# Patient Record
Sex: Female | Born: 1990 | Race: Black or African American | Hispanic: No | Marital: Single | Smoking: Former smoker
Health system: Southern US, Community
[De-identification: ages and names within clinical notes are randomized; demographics above are authoritative.]

## PROBLEM LIST (undated history)

## (undated) DIAGNOSIS — Z87440 Personal history of urinary (tract) infections: Secondary | ICD-10-CM

## (undated) HISTORY — DX: Personal history of urinary (tract) infections: Z87.440

## (undated) HISTORY — PX: OTHER SURGICAL HISTORY: SHX169

---
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
## 2011-07-04 IMAGING — US US OB US >=[ID] SNGL FETUS
1 series · 17 of 28 positions shown · non-contrast
Comparison: none

REASON FOR EXAM: Dates
COMMENTS:

[Series 1: us ob us >=(id) sngl fetus · 17 of 82 slices shown]
[im 1/82]
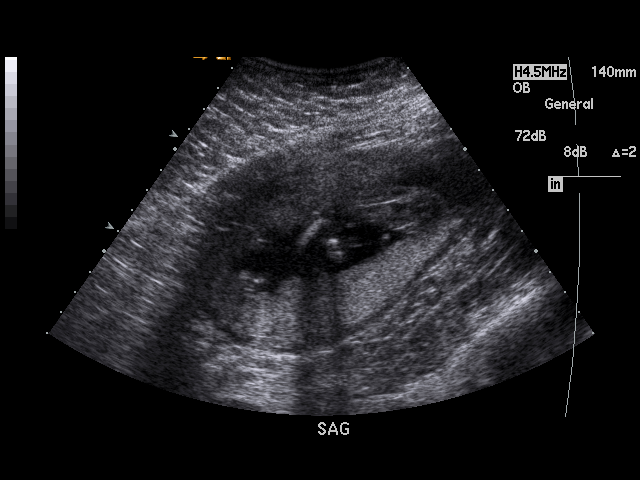
[im 7/82]
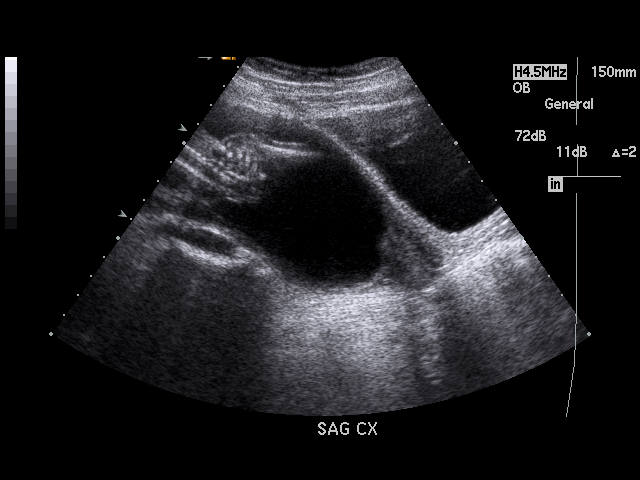
[im 13/82]
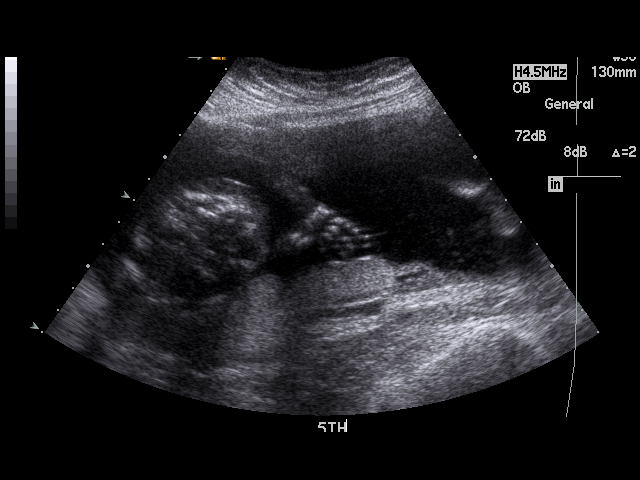
[im 16/82]
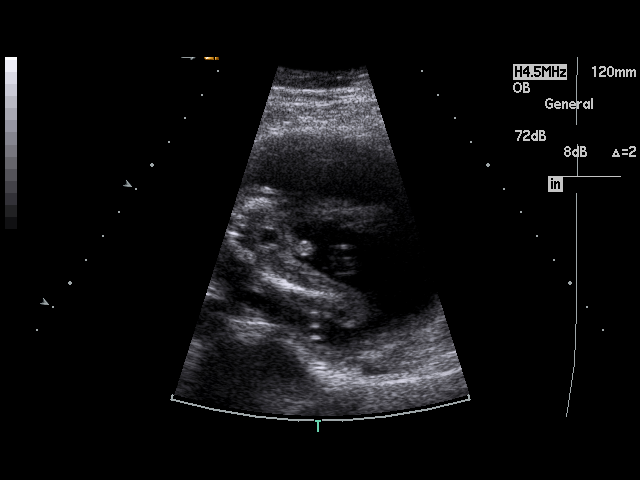
[im 22/82]
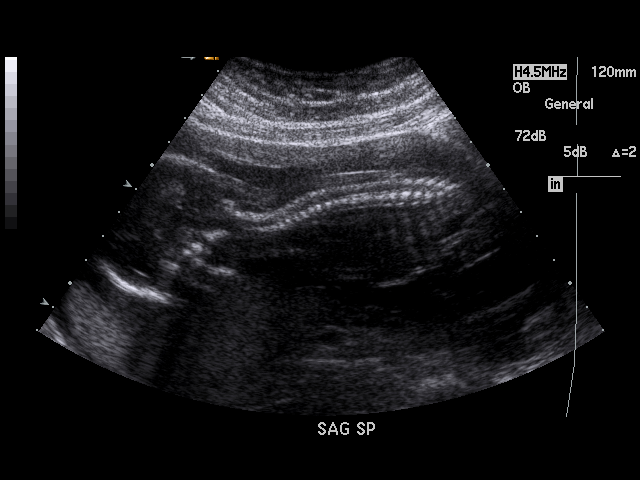
[im 28/82]
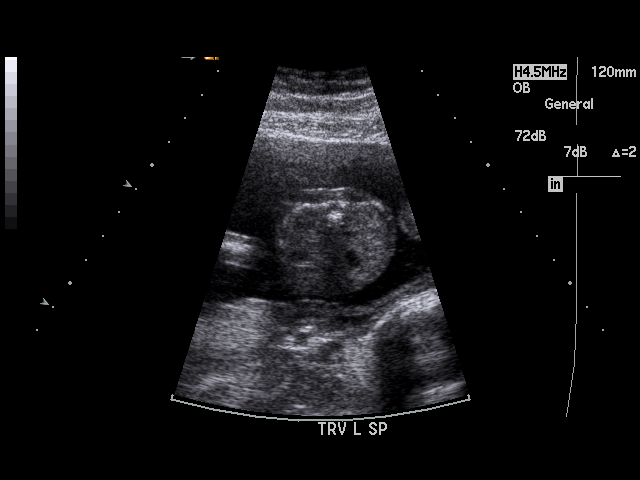
[im 31/82]
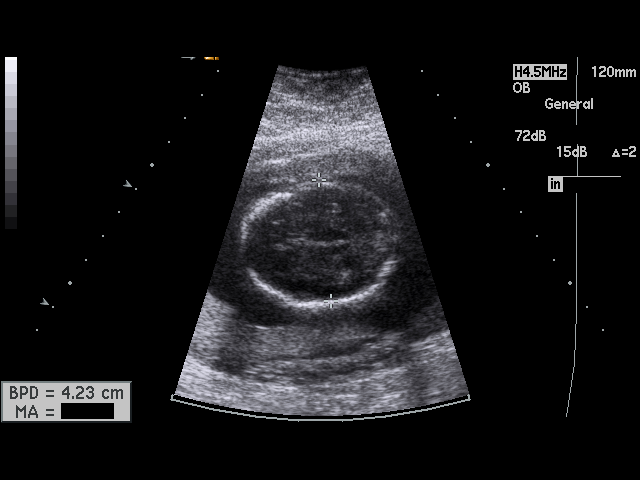
[im 37/82]
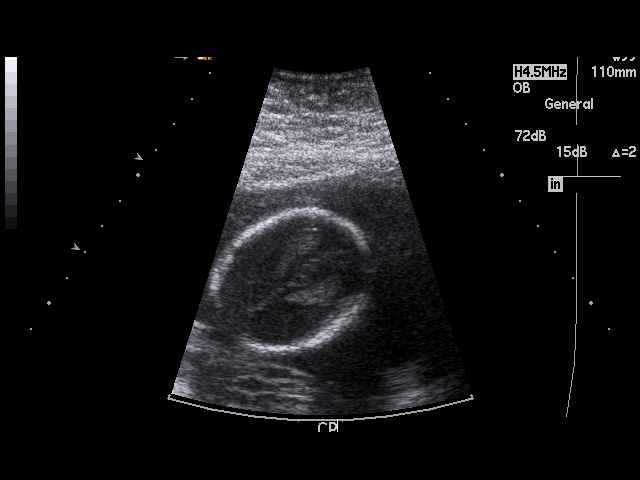
[im 43/82]
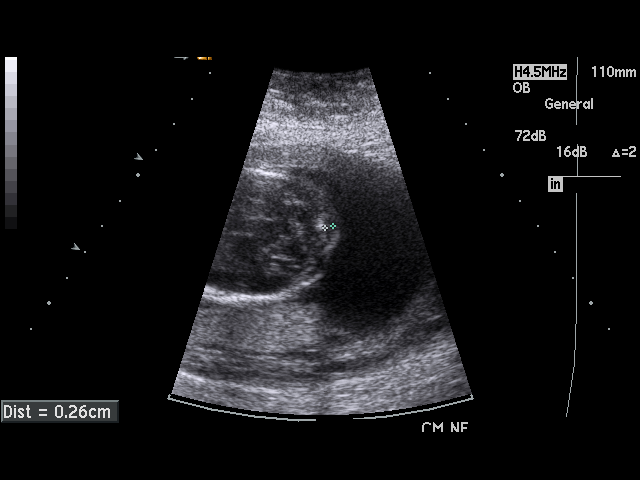
[im 46/82]
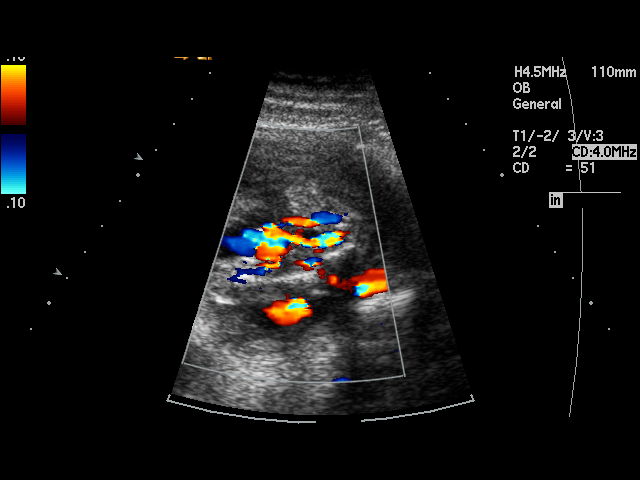
[im 52/82]
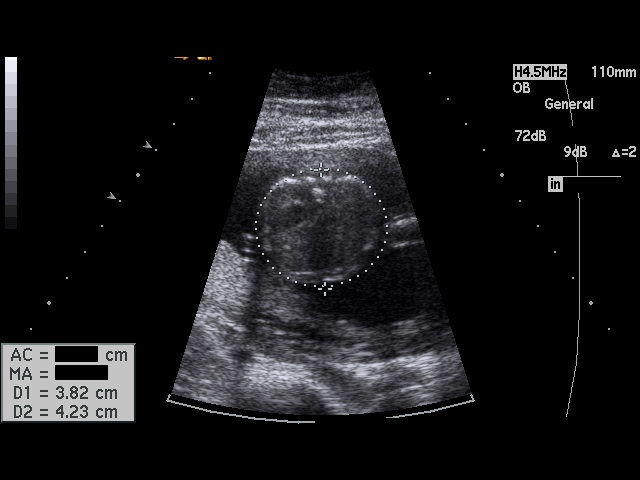
[im 55/82]
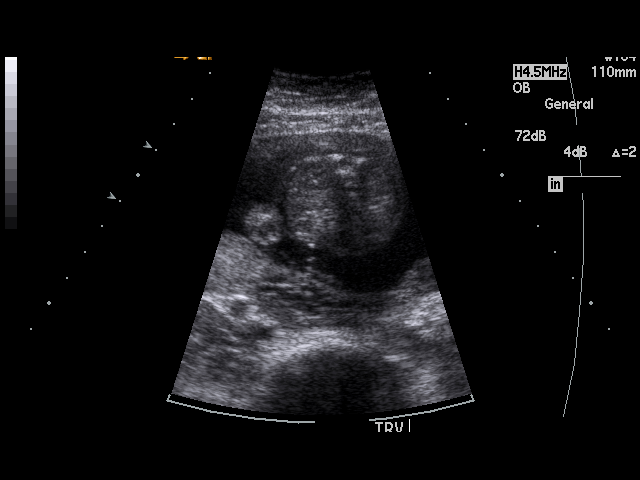
[im 61/82]
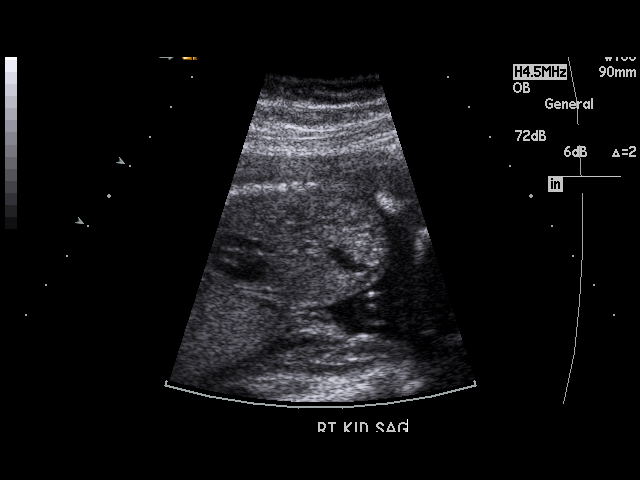
[im 67/82]
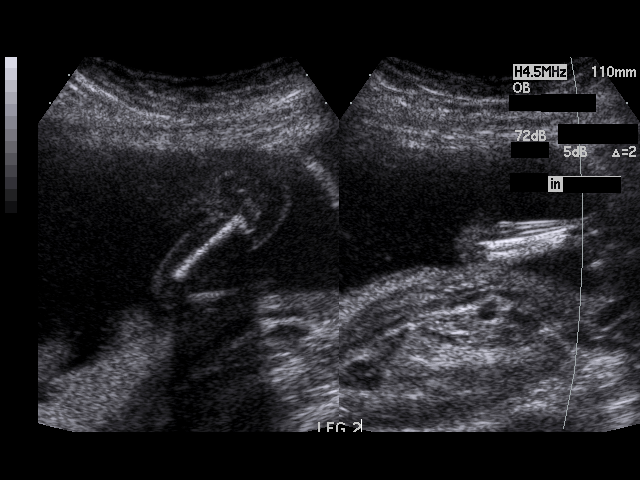
[im 70/82]
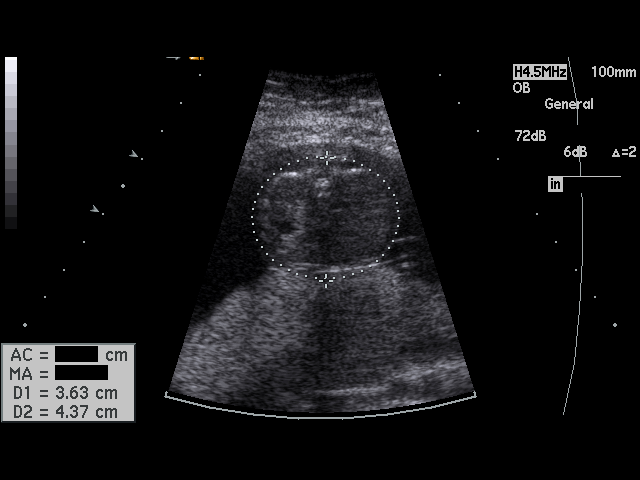
[im 76/82]
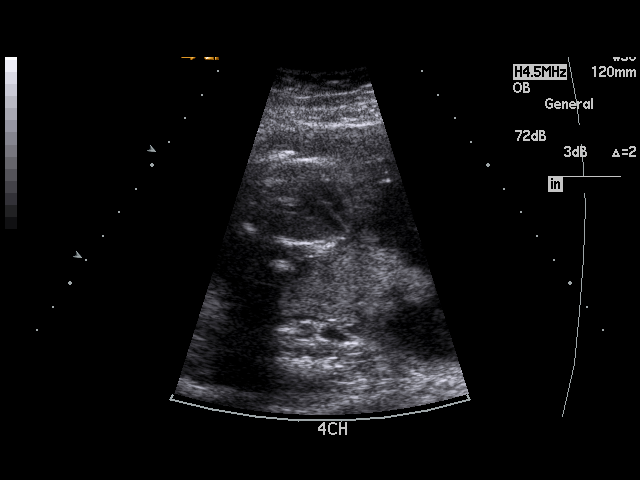
[im 82/82]
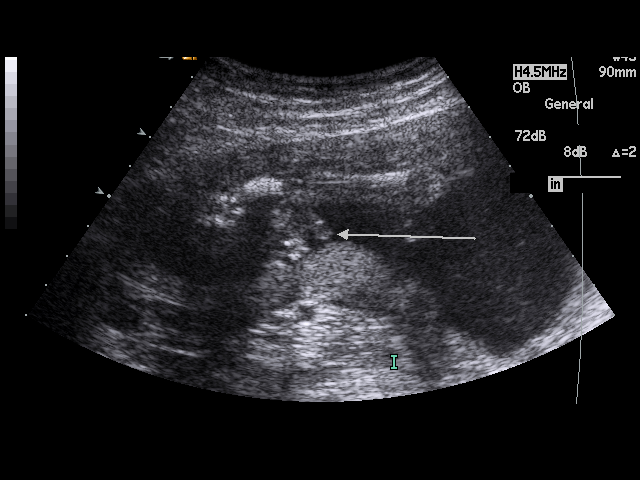

[17 of 28 positions shown; findings below may reference images not displayed]

PROCEDURE:     US  - US OB GREATER/OR EQUAL TO F3CS3  - July 24, 2009  [DATE]

RESULT:     There is observed a single living intrauterine gestation.
Amniotic volume appears normal. Fetal heart rate was monitored at 166 beats
per minute. The placenta is posterior. The inferior tip of the placenta is
approximately 8.5 cm above the cervix. Cervix length measures 2.9 cm.  The
fetal heart, stomach, and urinary bladder are visualized. No hydrocephalus
or hydronephrosis is seen. Fetal measurements are as follows:

BPD     4.21 cm      Corresponding to 18 weeks 5 days.
HC      16.24 cm      Corresponding to 19 weeks 0 days.
AC      12.89 cm      Corresponding to 18 weeks 3 days.
FL         2.9 cm      Corresponding to 18 weeks 6 days.

EFW is 276 grams + / - 37 grams. Average ultrasound age is 18 weeks 5 days.
Ultrasound EDD is December 20, 2009.
IMPRESSION: Please see above.

## 2012-05-31 ENCOUNTER — Emergency Department: Payer: Self-pay

## 2014-05-11 IMAGING — CR DG TIBIA/FIBULA 2V*L*
1 series · 2 of 2 positions shown · non-contrast
Comparison: none

REASON FOR EXAM: pain post fall
COMMENTS:

RESULT:     Left lower leg images demonstrate no fracture, dislocation or
radiopaque foreign body. Please see the separate dictation of the left knee.

[Series 1: x tib-fib ap left · 0.14mm/px · 2 of 2 slices shown]
[im 1/2]
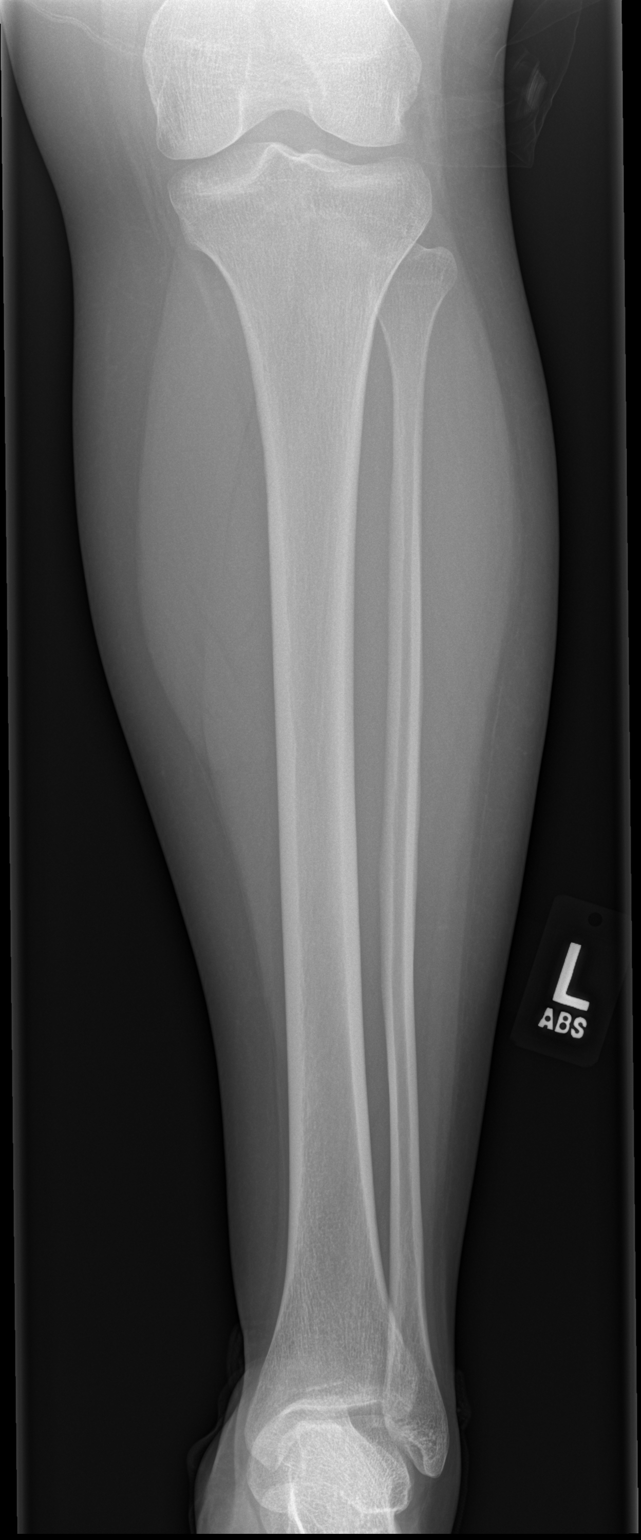
[im 2/2]
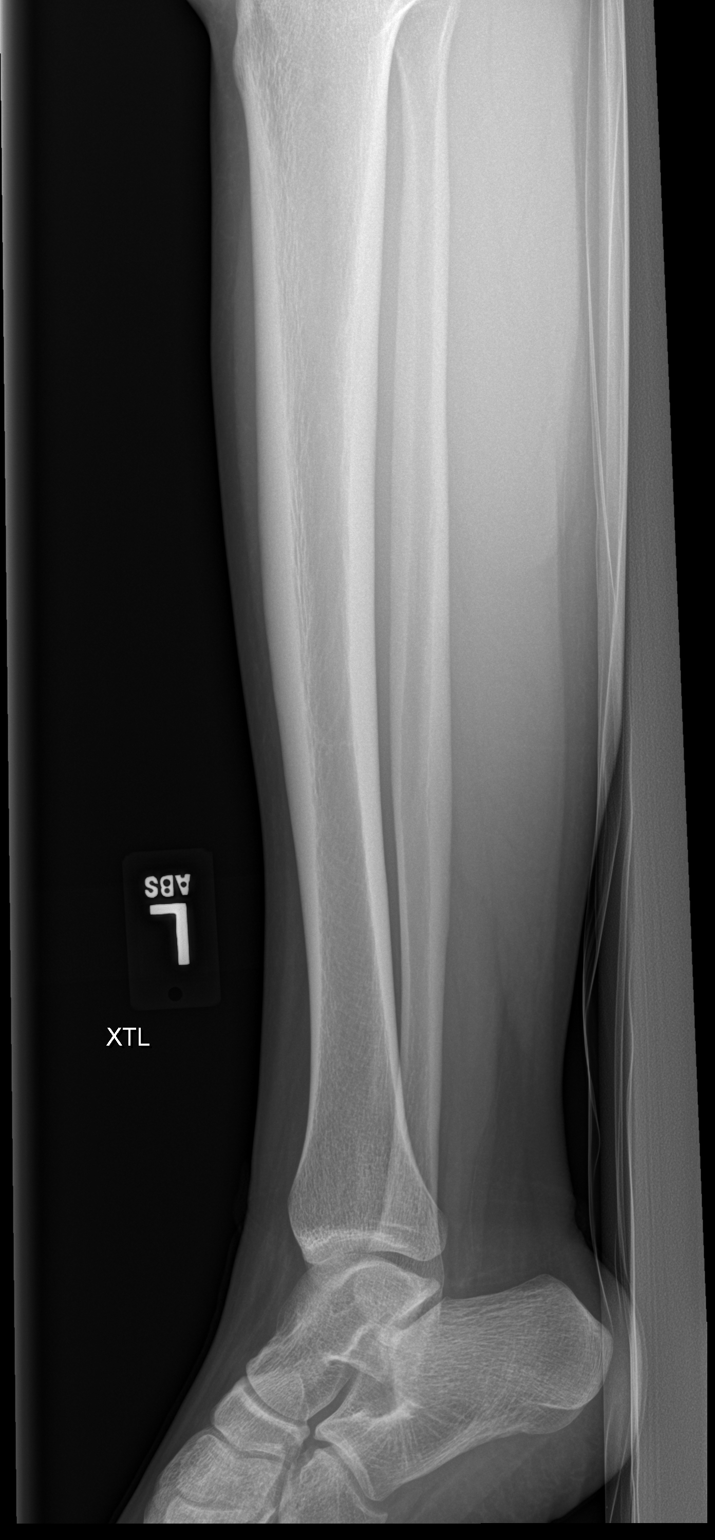

[2 of 2 positions shown; findings below may reference images not displayed]

IMPRESSION: Please see above.

[REDACTED]

## 2014-05-11 IMAGING — CR DG KNEE COMPLETE 4+V*L*
1 series · 4 of 4 positions shown · non-contrast
Comparison: none

REASON FOR EXAM: knee pain
COMMENTS:

PROCEDURE:     DXR - DXR KNEE LT COMP WITH OBLIQUES  - May 31, 2012  [DATE]
RESULT:     Comparison: None.

[Series 1: t knee ap left · 0.14mm/px · 4 of 4 slices shown]
[im 1/4]
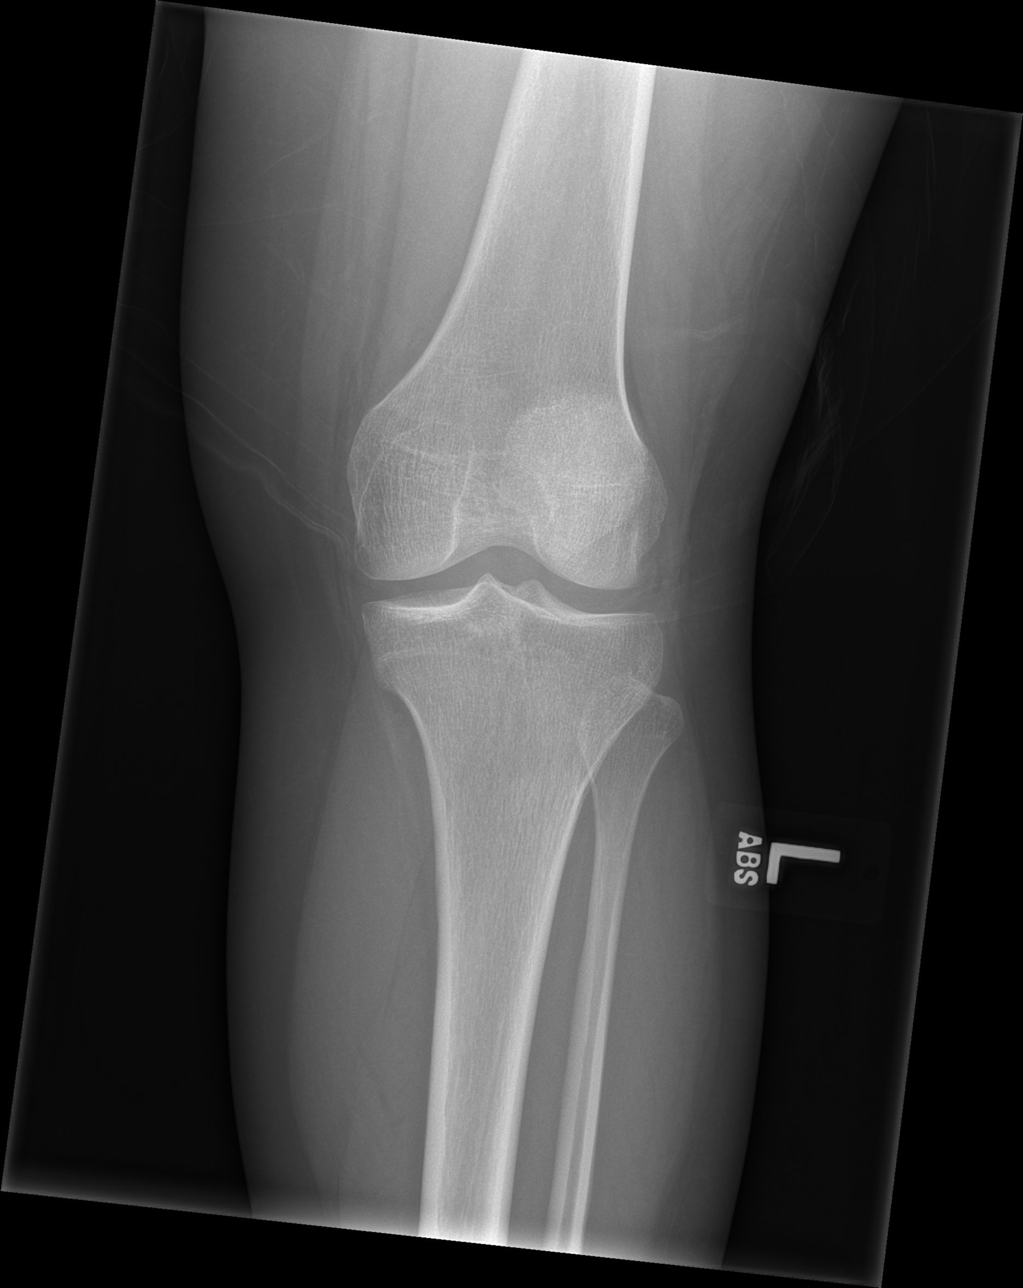
[im 2/4]
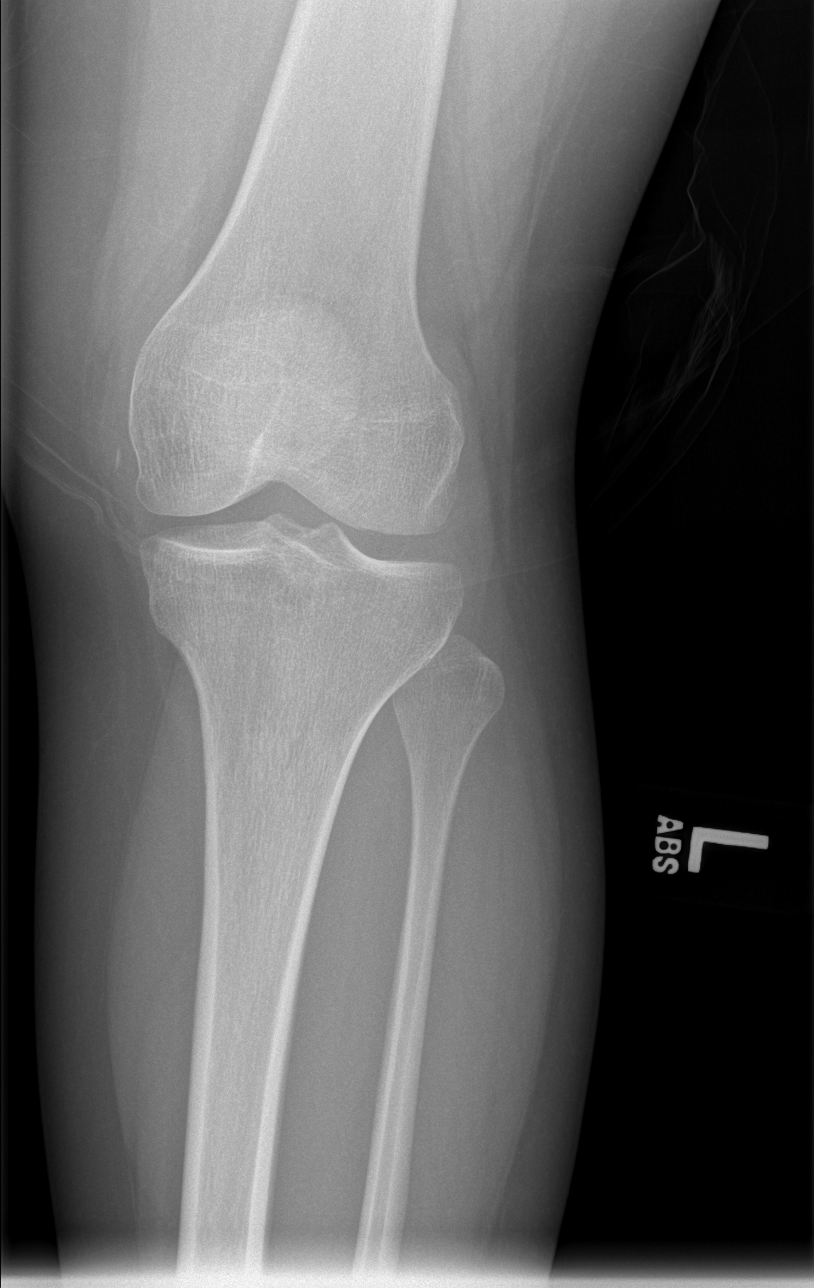
[im 3/4]
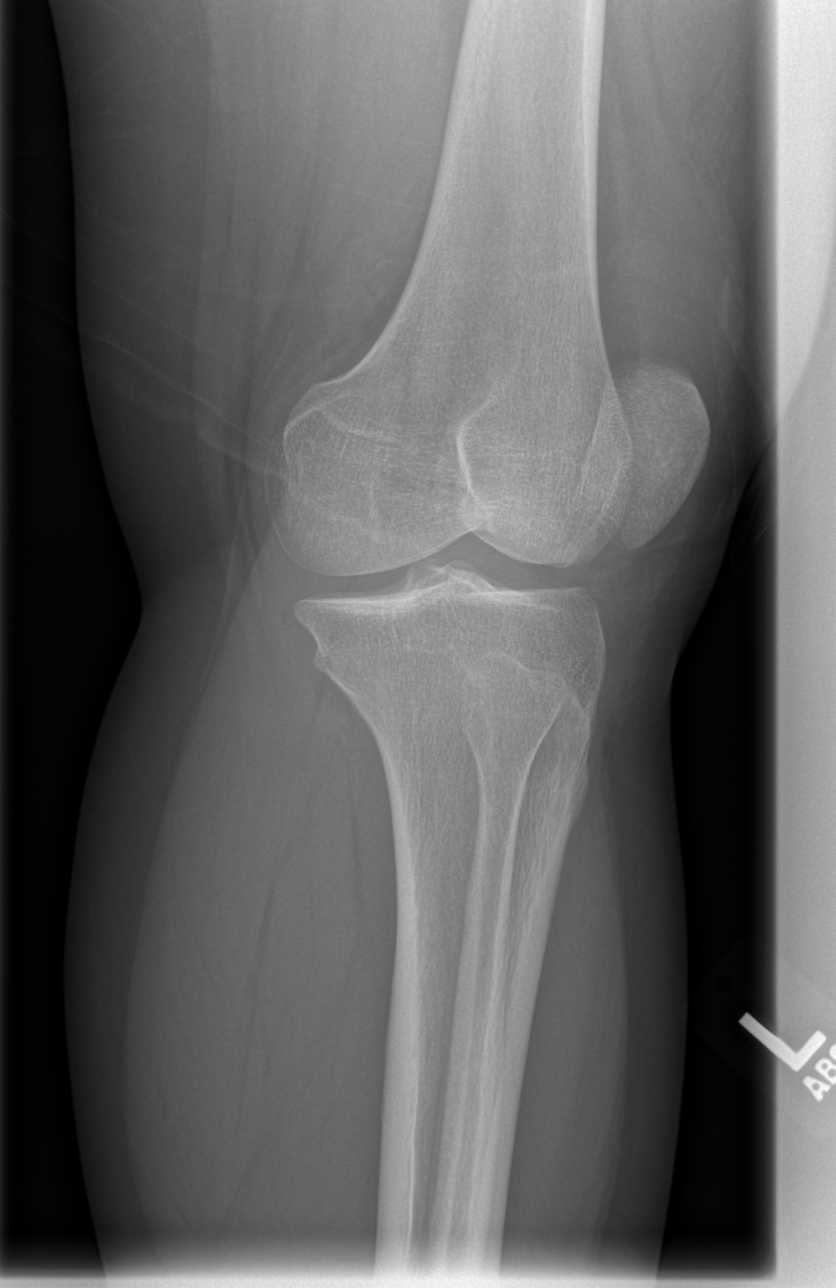
[im 4/4]
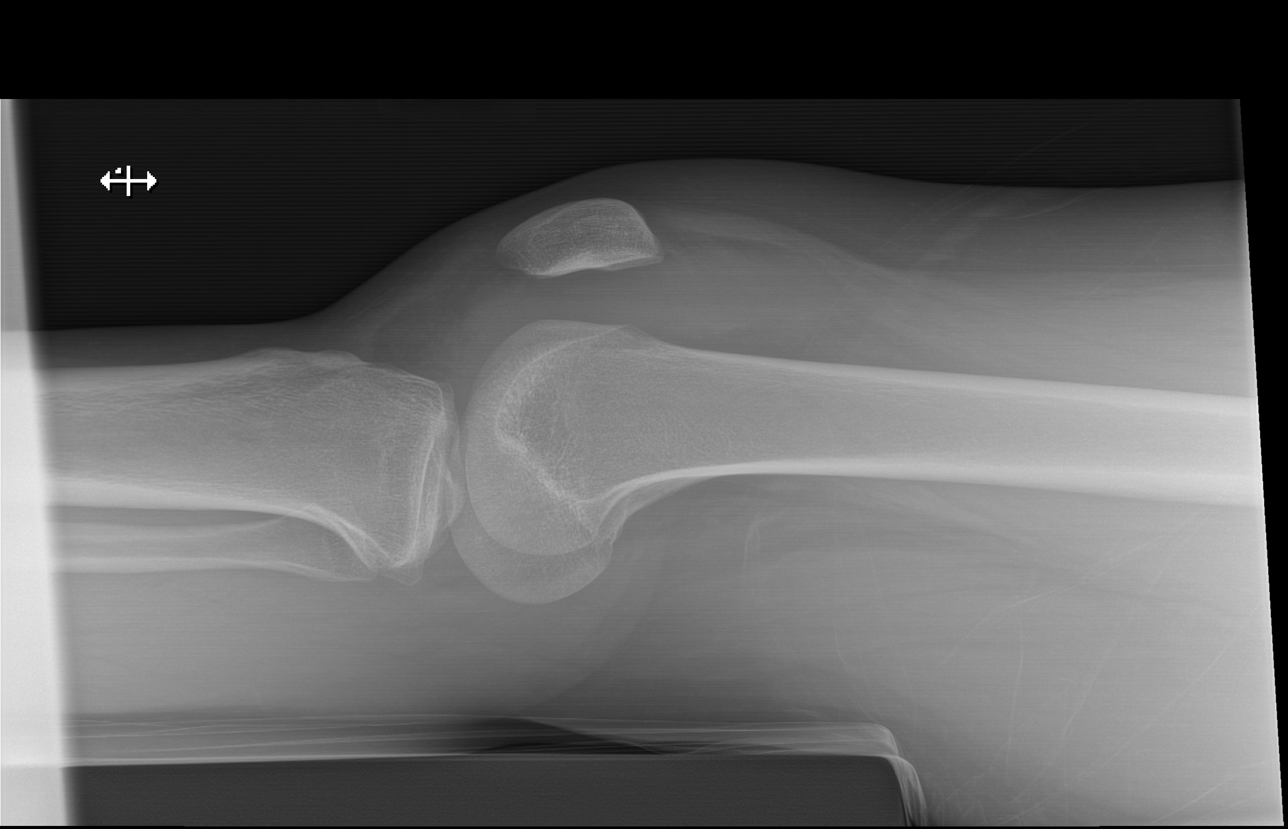

[4 of 4 positions shown; findings below may reference images not displayed]

FINDINGS: On the oblique image, there is a small crescentic calcific density adjacent
to the medial femoral condyle. This likely represents a small avulsion type
fracture, which would be in the region of the attachment of the MCL. There
is a moderate to large left knee effusion.
IMPRESSION: 1. Findings likely related to small avulsion type fracture at the attachment
of the MCL to the medial femoral condyle.
2. Moderate to large sized knee effusion.

Further evaluation could be provided with MRI, as indicated.

This was discussed with Klever Jumper at 5050 hours 05/31/2012.

[REDACTED]

## 2014-05-11 IMAGING — CR DG FEMUR 2V*L*
1 series · 5 of 5 positions shown · non-contrast
Comparison: none

REASON FOR EXAM: pain post fall
COMMENTS:

[Series 1: t femur proximal ap left · 0.14mm/px · 5 of 5 slices shown]
[im 1/5]
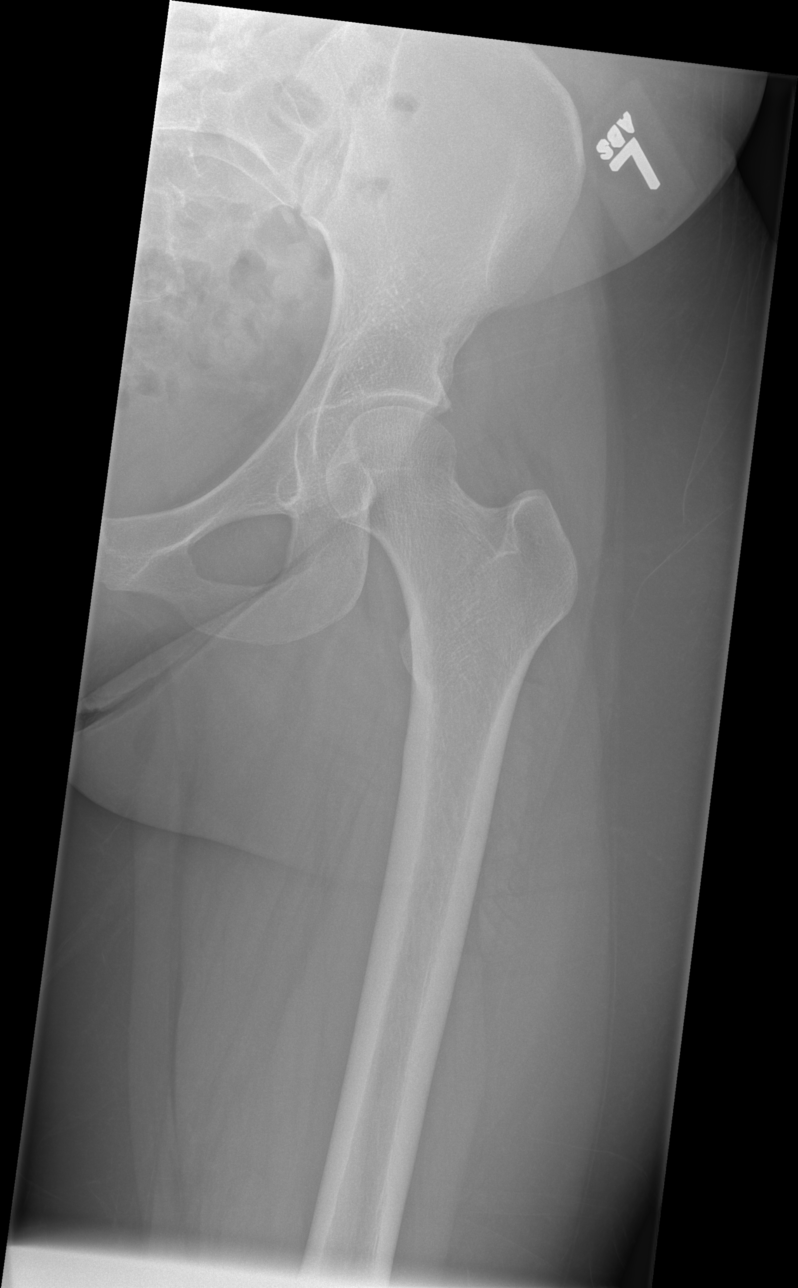
[im 2/5]
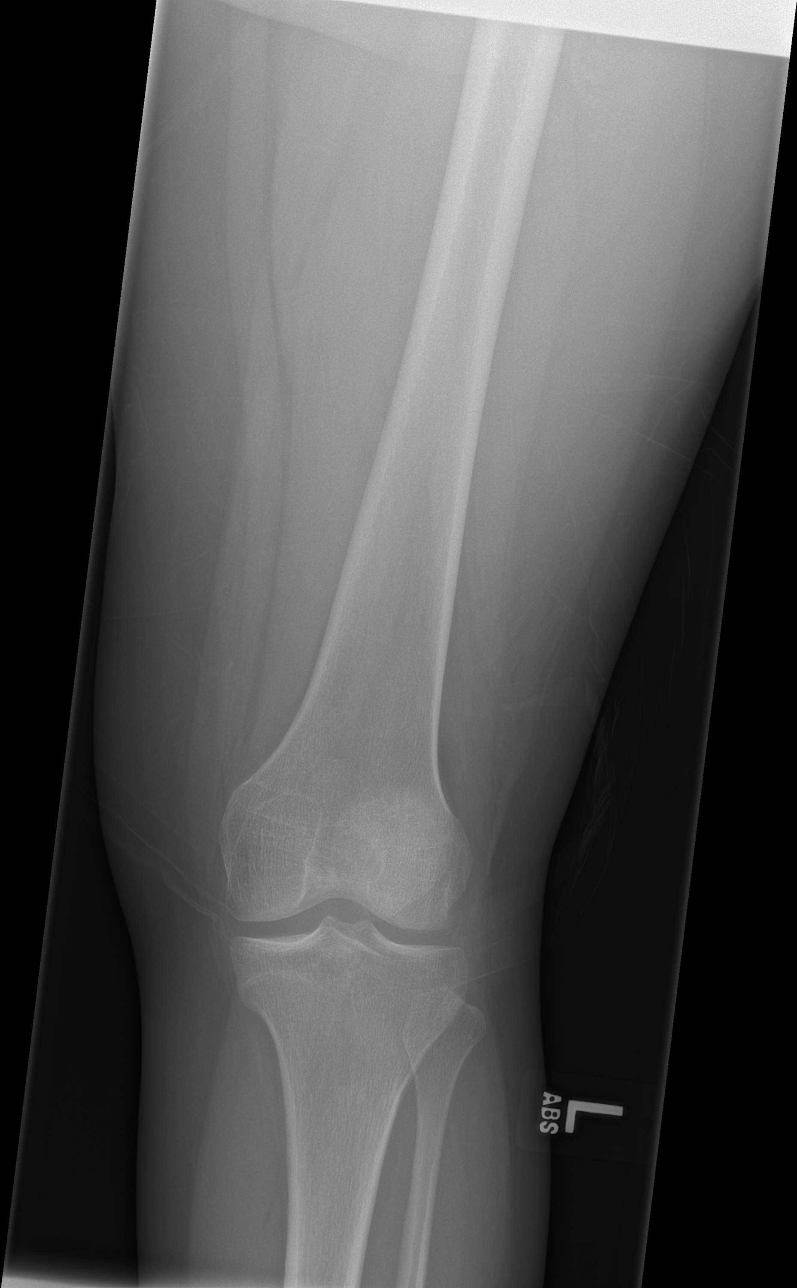
[im 3/5]
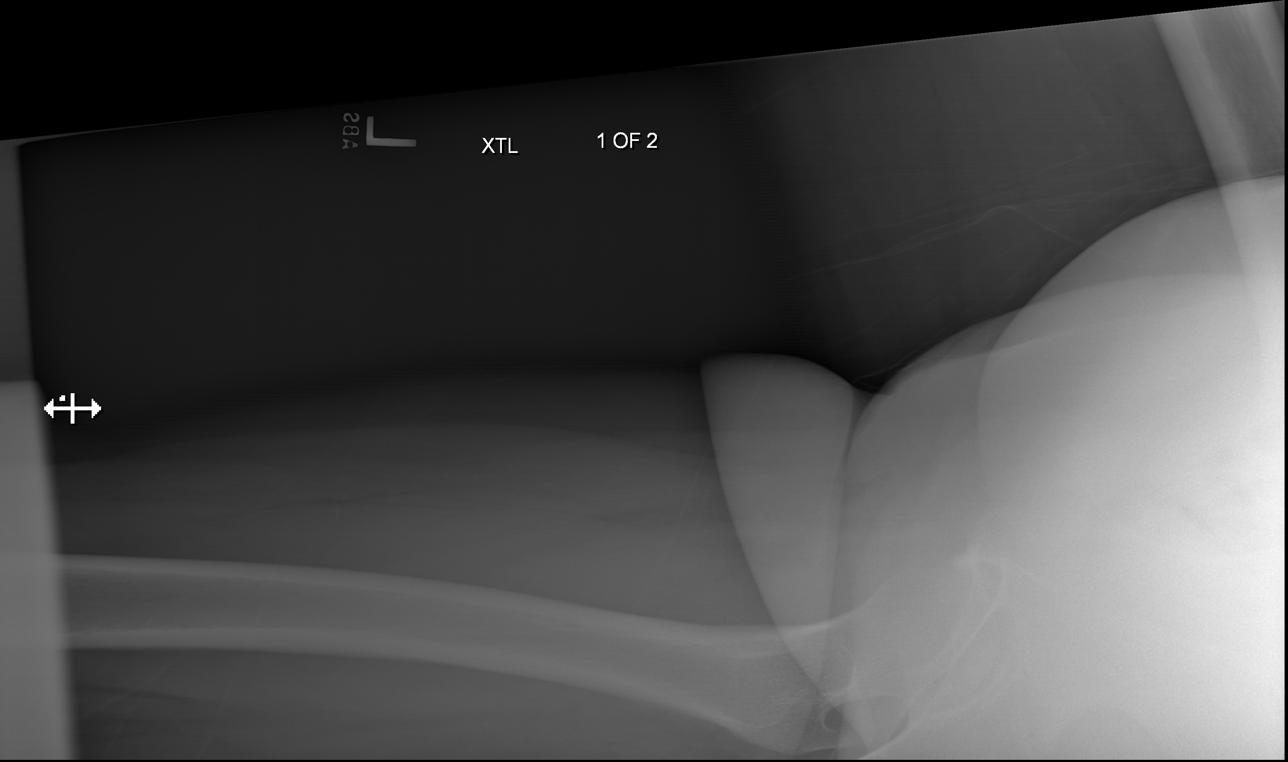
[im 4/5]
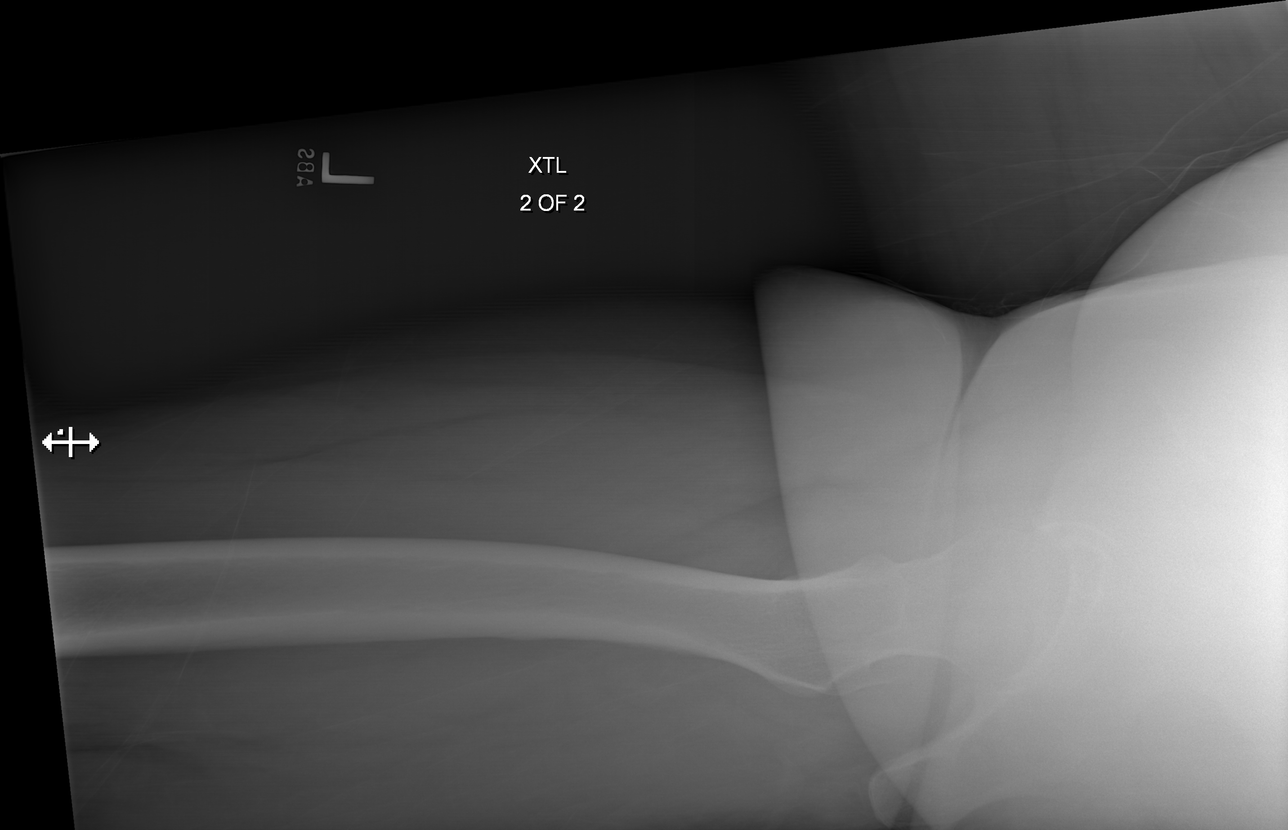
[im 5/5]
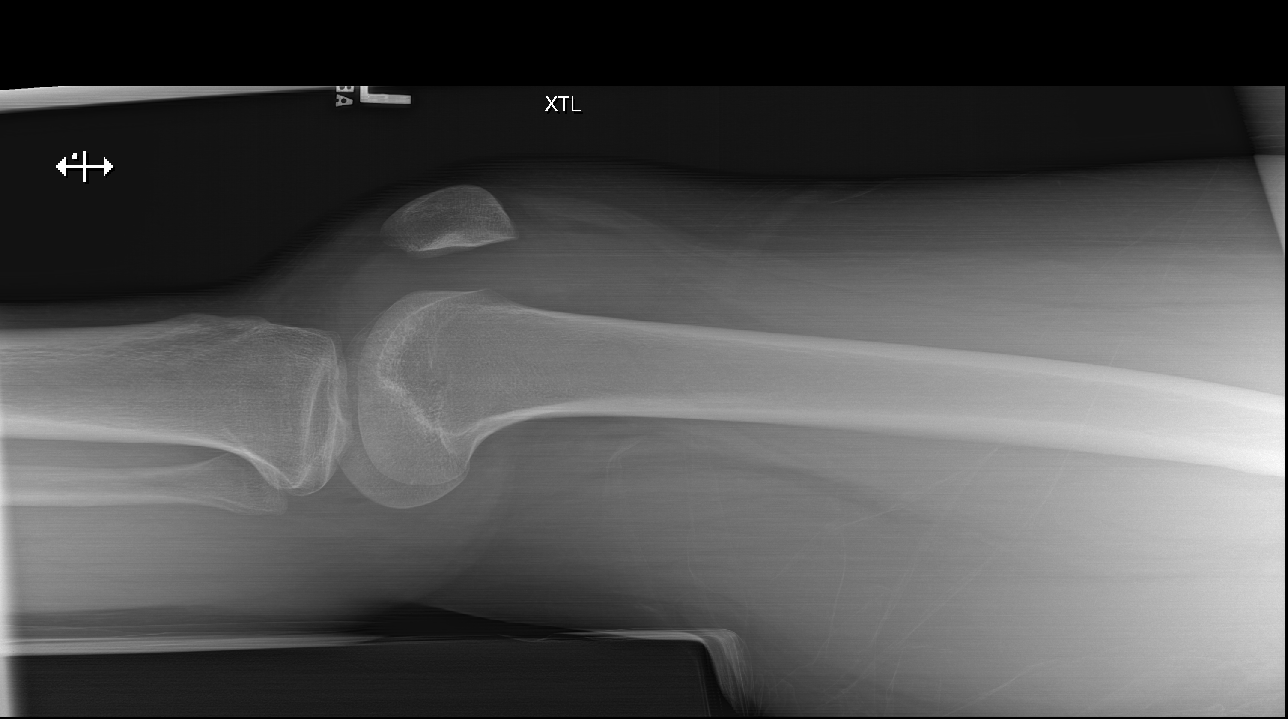

[5 of 5 positions shown; findings below may reference images not displayed]

PROCEDURE:     DXR - DXR FEMUR LEFT  - May 31, 2012  [DATE]

RESULT:     Left femur images demonstrate a large joint effusion. Please see
the separate name dictation. There is concern for an avulsion from the
medial aspect of the medial femoral condyle which could represent a medial
collateral ligament injury. The remaining portions of the femur appear
intact.
IMPRESSION: Please see above.

[REDACTED]

## 2014-09-04 ENCOUNTER — Emergency Department
Admission: EM | Admit: 2014-09-04 | Discharge: 2014-09-04 | Disposition: A | Discharge status: Home / Self Care | Payer: BLUE CROSS/BLUE SHIELD | Attending: Emergency Medicine | Admitting: Emergency Medicine

## 2014-09-04 ENCOUNTER — Encounter: Payer: Self-pay | Admitting: Emergency Medicine

## 2014-09-04 DIAGNOSIS — K088 Other specified disorders of teeth and supporting structures: Secondary | ICD-10-CM | POA: Insufficient documentation

## 2014-09-04 DIAGNOSIS — Z720 Tobacco use: Secondary | ICD-10-CM | POA: Diagnosis not present

## 2014-09-04 DIAGNOSIS — K048 Radicular cyst: Secondary | ICD-10-CM

## 2014-09-04 MED ORDER — HYDROCODONE-ACETAMINOPHEN 5-325 MG PO TABS: 1.0000 | ORAL_TABLET | ORAL | Status: DC | PRN

## 2014-09-04 MED ORDER — AMOXICILLIN 500 MG PO TABS: 500.0000 mg | ORAL_TABLET | Freq: Three times a day (TID) | ORAL | Status: DC

## 2014-09-04 MED ORDER — LIDOCAINE VISCOUS 2 % MT SOLN: 20.0000 mL | OROMUCOSAL | Status: DC | PRN

## 2014-09-04 NOTE — ED Notes (Signed)
Patient ambulatory to triage with steady gait, without difficulty or distress noted; pt c/o right lower dental pain x 2 days

## 2014-09-04 NOTE — Discharge Instructions (Signed)
Dental Care and Dentist Visits °Dental care supports good overall health. Regular dental visits can also help you avoid dental pain, bleeding, infection, and other more serious health problems in the future. It is important to keep the mouth healthy because diseases in the teeth, gums, and other oral tissues can spread to other areas of the body. Some problems, such as diabetes, heart disease, and pre-term labor have been associated with poor oral health.  °See your dentist every 6 months. If you experience emergency problems such as a toothache or broken tooth, go to the dentist right away. If you see your dentist regularly, you may catch problems early. It is easier to be treated for problems in the early stages.  °WHAT TO EXPECT AT A DENTIST VISIT  °Your dentist will look for many common oral health problems and recommend proper treatment. At your regular dental visit, you can expect: °· Gentle cleaning of the teeth and gums. This includes scraping and polishing. This helps to remove the sticky substance around the teeth and gums (plaque). Plaque forms in the mouth shortly after eating. Over time, plaque hardens on the teeth as tartar. If tartar is not removed regularly, it can cause problems. Cleaning also helps remove stains. °· Periodic X-rays. These pictures of the teeth and supporting bone will help your dentist assess the health of your teeth. °· Periodic fluoride treatments. Fluoride is a natural mineral shown to help strengthen teeth. Fluoride treatment involves applying a fluoride gel or varnish to the teeth. It is most commonly done in children. °· Examination of the mouth, tongue, jaws, teeth, and gums to look for any oral health problems, such as: °¨ Cavities (dental caries). This is decay on the tooth caused by plaque, sugar, and acid in the mouth. It is best to catch a cavity when it is small. °¨ Inflammation of the gums caused by plaque buildup (gingivitis). °¨ Problems with the mouth or malformed  or misaligned teeth. °¨ Oral cancer or other diseases of the soft tissues or jaws.  °KEEP YOUR TEETH AND GUMS HEALTHY °For healthy teeth and gums, follow these general guidelines as well as your dentist's specific advice: °· Have your teeth professionally cleaned at the dentist every 6 months. °· Brush twice daily with a fluoride toothpaste. °· Floss your teeth daily.  °· Ask your dentist if you need fluoride supplements, treatments, or fluoride toothpaste. °· Eat a healthy diet. Reduce foods and drinks with added sugar. °· Avoid smoking. °TREATMENT FOR ORAL HEALTH PROBLEMS °If you have oral health problems, treatment varies depending on the conditions present in your teeth and gums. °· Your caregiver will most likely recommend good oral hygiene at each visit. °· For cavities, gingivitis, or other oral health disease, your caregiver will perform a procedure to treat the problem. This is typically done at a separate appointment. Sometimes your caregiver will refer you to another dental specialist for specific tooth problems or for surgery. °SEEK IMMEDIATE DENTAL CARE IF: °· You have pain, bleeding, or soreness in the gum, tooth, jaw, or mouth area. °· A permanent tooth becomes loose or separated from the gum socket. °· You experience a blow or injury to the mouth or jaw area. °Document Released: 09/23/2010 Document Revised: 04/05/2011 Document Reviewed: 09/23/2010 °ExitCare® Patient Information ©2015 [employer]. This information is not intended to replace advice given to you by your health care provider. Make sure you discuss any questions you have with your health care provider. °OPTIONS FOR DENTAL FOLLOW UP CARE ° °[place] [facility]   of [facility] and [facility] Services - Local Safety Net Dental Clinics °http://www.ncdhhs.gov/dph/oralhealth/services/safetynetclinics.htm °  °[facility] (336-562-3123) ° °[facility] [place] (919-933-9087) ° °[facility] (919-663-1744 ext 237) ° °[facility] (336-570-6415) ° °[facility] (919-968-2025) °This clinic caters to the indigent population and is on a lottery system. °Location: °[facility], [address], [place] °Clinic Hours: °Wednesdays from 6pm - 9pm, patients seen by a lottery system. °For dates, call or go to www.med.[facility].edu/[facility]/patients/Dental-[facility] °Services: °Cleanings, fillings and simple extractions. °Payment Options: °DENTAL WORK IS FREE OF CHARGE. Bring proof of income or support. °Best way to get seen: °Arrive at 5:15 pm - this is a lottery, NOT first come/first serve, so arriving earlier will not increase your chances of being seen. °  °  °[facility] °919-537-3737 °Select option 1 for emergencies °  °Location: °[facility], [address], [place] °Clinic Hours: °No walk-ins accepted - call the day before to schedule an appointment. °Check in times are 9:30 am and 1:30 pm. °Services: °Simple extractions, temporary fillings, pulpectomy/pulp debridement, uncomplicated abscess drainage. °Payment Options: °PAYMENT IS DUE AT THE TIME OF SERVICE.  Fee is usually $100-200, additional surgical procedures (e.g. abscess drainage) may be extra. °Cash, checks, Visa/MasterCard accepted.  Can file Medicaid if patient is covered for dental - patient should call case worker to check. °No discount for [facility] patients. °Best way to get seen: °MUST call the day before and get onto the schedule. Can usually be seen the next 1-2 days. No walk-ins accepted. °  °  °[facility] °919-933-9087 °  °Location: °[facility], [address], [place] °Clinic Hours: °M, W, Th, F 8am or 1:30pm, Tues 9a or 1:30 - first come/first served. °Services: °Simple extractions, temporary fillings, uncomplicated abscess drainage.  You do not need to be an [place] resident. °Payment Options: °PAYMENT IS DUE AT THE TIME OF  SERVICE. °Dental insurance, otherwise sliding scale - bring proof of income or support. °Depending on income and treatment needed, cost is usually $50-200. °Best way to get seen: °Arrive early as it is first come/first served. °  °  °[facility] °919-542-1641 °  °Location: °[address] °Clinic Hours: °Mon-Thu 8a-5p °Services: °Most basic dental services including extractions and fillings. °Payment Options: °PAYMENT IS DUE AT THE TIME OF SERVICE. °Sliding scale, up to 50% off - bring proof if income or support. °Medicaid with dental option accepted. °Best way to get seen: °Call to schedule an appointment, can usually be seen within 2 weeks OR they will try to see walk-ins - show up at 8a or 2p (you may have to wait). °  °  °[facility] °919-245-2435 °[place] RESIDENTS ONLY °  °Location: °[facility], [address], [place], [place] [address] °Clinic Hours: By appointment only. °Monday - Thursday 8am-5pm, Friday 8am-12pm °Services: Cleanings, fillings, extractions. °Payment Options: °PAYMENT IS DUE AT THE TIME OF SERVICE. °Cash, Visa or MasterCard. Sliding scale - $30 minimum per service. °Best way to get seen: °Come in to office, complete packet and make an appointment - need proof of income °or support monies for each household member and proof of [place] residence. °Usually takes about a month to get in. °  °  °[facility] °[phone] °  °Location: °[address]  °Clinic Hours: Walk-in Urgent Care Dental Services   are offered Monday-Friday mornings only. °The numbers of emergencies accepted daily is limited to the number of °providers available. °Maximum 15 - Mondays, Wednesdays & Thursdays °Maximum 10 - Tuesdays & Fridays °Services: °You do not need to be a [place] resident to be seen for a dental emergency. °Emergencies are defined as pain, swelling, abnormal bleeding,  or dental trauma. Walkins will receive x-rays if needed. °NOTE: Dental cleaning is not an emergency. °Payment Options: °PAYMENT IS DUE AT THE TIME OF SERVICE. °Minimum co-pay is $40.00 for uninsured patients. °Minimum co-pay is $3.00 for Medicaid with dental coverage. °Dental Insurance is accepted and must be presented at time of visit. °Medicare does not cover dental. °Forms of payment: Cash, credit card, checks. °Best way to get seen: °If not previously registered with the clinic, walk-in dental registration begins at 7:15 am and is on a first come/first serve basis. °If previously registered with the clinic, call to make an appointment. °  °  °[facility] °919-776-4359 °[place] RESIDENTS ONLY °  °Location: °[address], [place], [place] °Clinic Hours: °Mon-Thu 10a-2p °Services: Extractions only! °Payment Options: °FREE (donations accepted) - bring proof of income or support °Best way to get seen: °Call and schedule an appointment OR come at 8am on the 1st Monday of every month (except for holidays) when it is first come/first served. °  °  °[facility] °[phone] °  °Location: °[address], [place] °Clinic Hours: °Friday mornings °Services, Payment Options, Best way to get seen: °Call for info ° °

## 2014-09-04 NOTE — ED Provider Notes (Signed)
[facility] Emergency Department Provider Note  ____________________________________________  Time seen: Approximately 7:13 PM  I have reviewed the triage vital signs and the nursing notes.   HISTORY  Chief Complaint Dental Pain    HPI Sara Richard is a 24 y.o. female presents with dental pain 2 days. States pain is on the right lower side. Unable to get into see a dentist.  History reviewed. No pertinent past medical history.  There are no active problems to display for this patient.   History reviewed. No pertinent past surgical history.  Current Outpatient Rx  Name  Route  Sig  Dispense  Refill  . amoxicillin (AMOXIL) 500 MG tablet   Oral   Take 1 tablet (500 mg total) by mouth 3 (three) times daily.   30 tablet   0   . HYDROcodone-acetaminophen (NORCO) 5-325 MG per tablet   Oral   Take 1-2 tablets by mouth every 4 (four) hours as needed for moderate pain.   15 tablet   0   . lidocaine (XYLOCAINE) 2 % solution   Mouth/Throat   Use as directed 20 mLs in the mouth or throat as needed for mouth pain.   100 mL   0     Allergies Review of patient's allergies indicates no known allergies.  No family history on file.  Social History Social History  Substance Use Topics  . Smoking status: Current Every Day Smoker -- 0.50 packs/day    Types: Cigarettes  . Smokeless tobacco: None  . Alcohol Use: No    Review of Systems Constitutional: No fever/chills Eyes: No visual changes. ENT: No sore throat. Positive for dental pain/cyst under the tongue. Cardiovascular: Denies chest pain. Respiratory: Denies shortness of breath. Gastrointestinal: No abdominal pain.  No nausea, no vomiting.  No diarrhea.  No constipation. Genitourinary: Negative for dysuria. Musculoskeletal: Negative for back pain. Skin: Negative for rash. Neurological: Negative for headaches, focal weakness or numbness.  10-point ROS otherwise  negative.  ____________________________________________   PHYSICAL EXAM:  VITAL SIGNS: ED Triage Vitals  Enc Vitals Group     BP 09/04/14 1908 130/74 mmHg     Pulse Rate 09/04/14 1908 73     Resp 09/04/14 1908 18     Temp 09/04/14 1908 98.3 F (36.8 C)     Temp src --      SpO2 09/04/14 1908 99 %     Weight 09/04/14 1908 115 lb (52.164 kg)     Height 09/04/14 1908 5' (1.524 m)     Head Cir --      Peak Flow --      Pain Score 09/04/14 1909 6     Pain Loc --      Pain Edu? --      Excl. in GC? --     Constitutional: Alert and oriented. Well appearing and in no acute distress. Head: Atraumatic. Nose: No congestion/rhinnorhea. Mouth/Throat: Mucous membranes are moist.  Oropharynx non-erythematous. Submental cyst noted under the tongue. Erythematous without drainage Skin:  Skin is warm, dry and intact. No rash noted. Psychiatric: Mood and affect are normal. Speech and behavior are normal.  ____________________________________________   LABS (all labs ordered are listed, but only abnormal results are displayed)  Labs Reviewed - No data to display ____________________________________________  PROCEDURES  Procedure(s) performed: None  Critical Care performed: No  ____________________________________________   INITIAL IMPRESSION / ASSESSMENT AND PLAN / ED COURSE  Pertinent labs & imaging results that were available during my  care of the patient were reviewed by me and considered in my medical decision making (see chart for details).  Acute dentalgia. Rx given for amoxicillin 500 mg 3 times a day #30 Motrin 800 mg 3 times a day #30 and hydrocodone as needed for severe pain. Patient voices no other emergency medical complaints at this time will return to the ER with any worsening symptomology. ____________________________________________   FINAL CLINICAL IMPRESSION(S) / ED DIAGNOSES  Final diagnoses:  Dental cyst      Evangeline Dakin, PA-C 09/04/14  1929  Sharman Cheek, MD 09/04/14 2244

## 2019-03-07 ENCOUNTER — Telehealth: Payer: Self-pay | Admitting: Family Medicine

## 2019-03-07 NOTE — Telephone Encounter (Signed)
WANTS NEXPLANON REPLACED

## 2019-03-08 NOTE — Telephone Encounter (Signed)
Phone call returned to patient. No answer, LMTC. Tawny Hopping, RN

## 2019-03-09 ENCOUNTER — Telehealth: Payer: Self-pay

## 2019-03-09 NOTE — Telephone Encounter (Signed)
returning phone call 

## 2019-03-09 NOTE — Telephone Encounter (Signed)
TC with patient.  Explained that Nexplanon is good up to 4 years per [facility].  Patients device was placed 03/2016. Patient verbalized understanding and will call back if any concerns. Richmond Campbell, RN

## 2020-01-09 ENCOUNTER — Ambulatory Visit: Payer: Self-pay

## 2020-01-09 ENCOUNTER — Ambulatory Visit (LOCAL_COMMUNITY_HEALTH_CENTER): Payer: Self-pay | Admitting: Family Medicine

## 2020-01-09 ENCOUNTER — Other Ambulatory Visit: Payer: Self-pay

## 2020-01-09 ENCOUNTER — Encounter: Payer: Self-pay | Admitting: Advanced Practice Midwife

## 2020-01-09 VITALS — BP 112/64 | HR 80 | Temp 98.1°F | Ht 60.5 in | Wt 175.2 lb

## 2020-01-09 DIAGNOSIS — N760 Acute vaginitis: Secondary | ICD-10-CM

## 2020-01-09 DIAGNOSIS — B9689 Other specified bacterial agents as the cause of diseases classified elsewhere: Secondary | ICD-10-CM

## 2020-01-09 DIAGNOSIS — Z3009 Encounter for other general counseling and advice on contraception: Secondary | ICD-10-CM

## 2020-01-09 DIAGNOSIS — Z01419 Encounter for gynecological examination (general) (routine) without abnormal findings: Secondary | ICD-10-CM

## 2020-01-09 DIAGNOSIS — Z3046 Encounter for surveillance of implantable subdermal contraceptive: Secondary | ICD-10-CM

## 2020-01-09 DIAGNOSIS — Z1272 Encounter for screening for malignant neoplasm of vagina: Secondary | ICD-10-CM

## 2020-01-09 LAB — WET PREP FOR TRICH, YEAST, CLUE
Trichomonas Exam: NEGATIVE
Yeast Exam: NEGATIVE

## 2020-01-09 LAB — PREGNANCY, URINE: Preg Test, Ur: NEGATIVE

## 2020-01-09 MED ORDER — METRONIDAZOLE 500 MG PO TABS: 500.0000 mg | ORAL_TABLET | Freq: Two times a day (BID) | ORAL | 0 refills | Status: DC

## 2020-01-09 MED ORDER — ETONOGESTREL 68 MG ~~LOC~~ IMPL: 68.0000 mg | DRUG_IMPLANT | Freq: Once | SUBCUTANEOUS | Status: AC

## 2020-01-09 NOTE — Progress Notes (Signed)
Client thinks might have had a STI several years ago. Does not remember STI or medicine treated with. Jossie Ng, RN  UPT negative and Elveria Rising FNP-BC notified. Treated for BV per verbal order of Elveria Rising FNP-BC. Jossie Ng, RN

## 2020-01-09 NOTE — Progress Notes (Signed)
Family Planning Visit- Repeat Yearly Visit  Subjective:  Sara Richard is a 29 y.o. G2P1010  being seen today for an well woman visit and to discuss family planning options.    She is currently using Nexplanon for pregnancy prevention. Patient reports she does not want a pregnancy in the next year. Patient  does not have a problem list on file.  Chief Complaint  Patient presents with  . Annual Exam    Nexplanon removal / reinsertion    Patient reports here for physical and nexplanon removal reinsertion.    Patient denies any problems See flowsheet for other program required questions.   Body mass index is 33.65 kg/m. - Patient is eligible for diabetes screening based on BMI and age >58?  not applicable HA1C ordered? not applicable  Patient reports 1 of partners in last year. Desires STI screening?  Yes   Has patient been screened once for HCV in the past?  No  No results found for: HCVAB  Does the patient have current of drug use, have a partner with drug use, and/or has been incarcerated since last result? No  If yes-- Screen for HCV through [facility]   Does the patient meet criteria for HBV testing? Yes  Criteria:  -Household, sexual or needle sharing contact with HBV -History of drug use -HIV positive -Those with known Hep C   Health Maintenance Due  Topic Date Due  . Hepatitis C Screening  Never done  . COVID-19 Vaccine (1) Never done  . HIV Screening  Never done  . TETANUS/TDAP  Never done  . PAP-Cervical Cytology Screening  Never done  . PAP SMEAR-Modifier  Never done  . INFLUENZA VACCINE  Never done    Review of Systems  All other systems reviewed and are negative.   The following portions of the patient's history were reviewed and updated as appropriate: allergies, current medications, past family history, past medical history, past social history, past surgical history and problem list. Problem list updated.  Objective:   Vitals:   01/09/20  1421  BP: 112/64  Pulse: 80  Temp: 98.1 F (36.7 C)  Weight: 175 lb 3.2 oz (79.5 kg)  Height: 5' 0.5" (1.537 m)    Physical Exam Vitals and nursing note reviewed.  Constitutional:      Appearance: Normal appearance. She is obese.  HENT:     Head: Normocephalic and atraumatic.     Mouth/Throat:     Mouth: Mucous membranes are moist.     Pharynx: Oropharynx is clear. No oropharyngeal exudate or posterior oropharyngeal erythema.  Eyes:     General: No scleral icterus. Cardiovascular:     Rate and Rhythm: Normal rate and regular rhythm.     Pulses: Normal pulses.     Heart sounds: Normal heart sounds.  Pulmonary:     Effort: Pulmonary effort is normal.     Breath sounds: Normal breath sounds.  Abdominal:     General: Abdomen is flat. Bowel sounds are normal.     Palpations: Abdomen is soft.  Genitourinary:    Comments: External genitalia without, lice, nits, erythema, edema , lesions or inguinal adenopathy. Vagina with normal mucosa and discharge and pH equals 4.  Cervix without visual lesions, uterus firm, mobile, non-tender, no masses, CMT adnexal fullness or tenderness.  Musculoskeletal:        General: Normal range of motion.     Cervical back: Normal range of motion and neck supple.  Lymphadenopathy:  Cervical: No cervical adenopathy.  Skin:    General: Skin is warm and dry.       Neurological:     General: No focal deficit present.     Mental Status: She is alert.       Assessment and Plan:  Sara Richard is a 29 y.o. female G2P1010 presenting to the [facility] Department for an yearly well woman exam/family planning visit  Contraception counseling: Reviewed all forms of birth control options in the tiered based approach. available including abstinence; over the counter/barrier methods; hormonal contraceptive medication including pill, patch, ring, injection,contraceptive implant, ECP; hormonal and nonhormonal IUDs; permanent sterilization  options including vasectomy and the various tubal sterilization modalities. Risks, benefits, and typical effectiveness rates were reviewed.  Questions were answered.  Written information was also given to the patient to review.  Patient desires nexplanon, this was prescribed for patient. She will follow up in as needed for surveillance.  She was told to call with any further questions, or with any concerns about this method of contraception.  Emphasized use of condoms 100% of the time for STI prevention.  Patient was not offered ECP based on patient has nexplanon.      1. Family planning counseling Well woman visit, pap and clinical breast exam done  Screenings for STI also completed.  Wet prep negative  Treat wet prep for BV  With metronidazole 500 mg BID x 7 days.    2. Smear, vaginal, as part of routine gynecological examination Last pap was 2016 NIL.  Pap collected today.   3. Encounter for surveillance of implantable subdermal contraceptive Nexplanon Removal and Insertion  Pt. Had nexplanon placed in 2018 wants removal reinsertion today.   Patient identified, informed consent performed, consent signed.   Patient does understand that irregular bleeding is a very common side effect of this medication. She was advised to have backup contraception for one week after replacement of the implant. Patient deemed to meet WHO criteria for being reasonably certain she is not pregnant.  Appropriate time out taken. Nexplanon site identified. Area prepped in usual sterile fashon. 3 ml of 1% lidocaine with epinephrine was used to anesthetize the area at the distal end of the implant. A small stab incision was made right beside the implant on the distal portion. The Nexplanon rod was grasped using hemostats and removed without difficulty. There was minimal blood loss. There were no complications.   Confirmed correct location of insertion site. The insertion site was identified 8-10 cm (3-4 inches) from the  medial epicondyle of the humerus and 3-5 cm (1.25-2 inches) posterior to (below) the sulcus (groove) between the biceps and triceps muscles of the patient's left arm. New Nexplanon removed from packaging, Device confirmed in needle, then inserted full length of needle and withdrawn per handbook instructions. Nexplanon was able to palpated in the patient's left arm; patient palpated the insert herself.  There was minimal blood loss. Patient insertion site covered with guaze and a pressure bandage to reduce any bruising. The patient tolerated the procedure well and was given post procedure instructions.       Return in about 1 year (around 01/08/2021) for annual well woman exam.  No future appointments.  Wendi Snipes, FNP

## 2020-01-12 LAB — PAP IG (IMAGE GUIDED): PAP Smear Comment: 0

## 2020-01-13 LAB — GONOCOCCUS CULTURE

## 2020-01-16 LAB — HEPATITIS B SURFACE ANTIGEN: Hepatitis B Surface Ag: NEGATIVE

## 2020-01-16 LAB — HM HIV SCREENING LAB: HM HIV Screening: NEGATIVE

## 2020-01-16 LAB — HM HEPATITIS C SCREENING LAB: HM Hepatitis Screen: NEGATIVE

## 2020-03-18 ENCOUNTER — Encounter: Payer: Self-pay | Admitting: Student

## 2020-03-18 LAB — HM HIV SCREENING LAB: HM HIV Screening: NEGATIVE

## 2020-03-18 LAB — HM HEPATITIS C SCREENING LAB: HM Hepatitis Screen: NEGATIVE

## 2020-03-25 LAB — HEPATITIS B SURFACE ANTIGEN

## 2020-08-01 ENCOUNTER — Ambulatory Visit: Payer: Self-pay | Admitting: Advanced Practice Midwife

## 2020-08-01 ENCOUNTER — Other Ambulatory Visit: Payer: Self-pay

## 2020-08-01 ENCOUNTER — Encounter: Payer: Self-pay | Admitting: Advanced Practice Midwife

## 2020-08-01 DIAGNOSIS — Z113 Encounter for screening for infections with a predominantly sexual mode of transmission: Secondary | ICD-10-CM

## 2020-08-01 DIAGNOSIS — E663 Overweight: Secondary | ICD-10-CM

## 2020-08-01 LAB — WET PREP FOR TRICH, YEAST, CLUE
Trichomonas Exam: NEGATIVE
Yeast Exam: NEGATIVE

## 2020-08-01 NOTE — Progress Notes (Signed)
[facility] Department STI clinic/screening visit  Subjective:  Sara Richard is a 30 y.o. SBF G2P1 vaper female being seen today for an STI screening visit. The patient reports they do not have symptoms.  Patient reports that they do not desire a pregnancy in the next year.   They reported they are not interested in discussing contraception today.  No LMP recorded. Patient has had an implant.   Patient has the following medical conditions:   Patient Active Problem List   Diagnosis Date Noted   Overweight 08/01/2020    Chief Complaint  Patient presents with   SEXUALLY TRANSMITTED DISEASE    HPI  Patient reports no symptoms. Last sex 07/29/20 without condom; with current partner x 3 years; 2 sex partners in last 3 mo. LMP 07/20/20. Last MJ yesterday. Last vaped today. Last cig 3 years ago. Last ETOH 07/26/20 (3 shots liquor) 1x/mo.   Last HIV test per patient/review of record was 03/18/20 Patient reports last pap was 01/09/20 neg  See flowsheet for further details and programmatic requirements.    The following portions of the patient's history were reviewed and updated as appropriate: allergies, current medications, past medical history, past social history, past surgical history and problem list.  Objective:  There were no vitals filed for this visit.  Physical Exam Vitals and nursing note reviewed.  Constitutional:      Appearance: Normal appearance. She is obese.  HENT:     Head: Normocephalic and atraumatic.     Mouth/Throat:     Mouth: Mucous membranes are moist.     Pharynx: Oropharynx is clear. No oropharyngeal exudate or posterior oropharyngeal erythema.  Pulmonary:     Effort: Pulmonary effort is normal.  Chest:  Breasts:    Right: No axillary adenopathy or supraclavicular adenopathy.     Left: No axillary adenopathy or supraclavicular adenopathy.  Abdominal:     General: Abdomen is flat.     Palpations: There is no mass.     Tenderness: There is  no abdominal tenderness. There is no rebound.     Comments: Soft without masses or tenderness, increased adipose  Genitourinary:    General: Normal vulva.     Exam position: Lithotomy position.     Pubic Area: No rash or pubic lice.      Labia:        Right: No rash or lesion.        Left: No rash or lesion.      Vagina: Normal. No vaginal discharge (white creamy leukorrhea, ph>4.5, small amt), erythema, bleeding or lesions.     Cervix: No cervical motion tenderness, discharge, friability, lesion or erythema.     Uterus: Normal.      Adnexa: Right adnexa normal and left adnexa normal.     Rectum: Normal.  Lymphadenopathy:     Head:     Right side of head: No preauricular or posterior auricular adenopathy.     Left side of head: No preauricular or posterior auricular adenopathy.     Cervical:     Right cervical: No superficial, deep or posterior cervical adenopathy.    Left cervical: No superficial, deep or posterior cervical adenopathy.     Upper Body:     Right upper body: No supraclavicular or axillary adenopathy.     Left upper body: No supraclavicular or axillary adenopathy.     Lower Body: No right inguinal adenopathy. No left inguinal adenopathy.  Skin:    General: Skin is  warm and dry.     Findings: No rash.  Neurological:     Mental Status: She is alert and oriented to person, place, and time.     Assessment and Plan:  Sara Richard is a 30 y.o. female presenting to the [facility] Department for STI screening  1. Screening examination for venereal disease Treat wet mount per standing orders Immunization nurse consult - WET PREP FOR TRICH, YEAST, CLUE - Gonococcus culture - Chlamydia/Gonorrhea [place] [facility] - HIV [place] [facility] - Syphilis Serology, [place] [facility]  2. Overweight      No follow-ups on file.  No future appointments.  Alberteen Spindle, CNM

## 2020-08-01 NOTE — Progress Notes (Signed)
Wet mount reviewed, providers ordered were completed.

## 2020-08-05 LAB — GONOCOCCUS CULTURE

## 2020-08-05 LAB — HM HIV SCREENING LAB: HM HIV Screening: NEGATIVE

## 2021-01-08 ENCOUNTER — Other Ambulatory Visit: Payer: Self-pay

## 2021-01-08 ENCOUNTER — Ambulatory Visit: Payer: Self-pay | Admitting: Nurse Practitioner

## 2021-01-08 ENCOUNTER — Encounter: Payer: Self-pay | Admitting: Family Medicine

## 2021-01-08 ENCOUNTER — Ambulatory Visit: Payer: Self-pay

## 2021-01-08 DIAGNOSIS — N72 Inflammatory disease of cervix uteri: Secondary | ICD-10-CM

## 2021-01-08 DIAGNOSIS — Z113 Encounter for screening for infections with a predominantly sexual mode of transmission: Secondary | ICD-10-CM

## 2021-01-08 LAB — WET PREP FOR TRICH, YEAST, CLUE
Clue Cell Exam: POSITIVE — AB
Trichomonas Exam: NEGATIVE
Yeast Exam: NEGATIVE

## 2021-01-08 LAB — HM HIV SCREENING LAB: HM HIV Screening: NEGATIVE

## 2021-01-08 LAB — HEPATITIS B SURFACE ANTIGEN: Hepatitis B Surface Ag: NONREACTIVE

## 2021-01-08 LAB — HM HEPATITIS C SCREENING LAB: HM Hepatitis Screen: NEGATIVE

## 2021-01-08 MED ORDER — DOXYCYCLINE HYCLATE 100 MG PO TABS: 100.0000 mg | ORAL_TABLET | Freq: Two times a day (BID) | ORAL | 0 refills | Status: AC

## 2021-01-08 NOTE — Progress Notes (Signed)
Pt here for STD screening.  Wet mount results reviewed and medication dispensed per Provider orders. Daymen Hassebrock M Mattheu Brodersen, RN ° ° °

## 2021-01-08 NOTE — Progress Notes (Signed)
[facility] Department  STI clinic/screening visit [address] [place] [address] [phone]  Subjective:  Sara Richard is a 30 y.o. female being seen today for an STI screening visit. The patient reports they do have symptoms.  Patient reports that they do not desire a pregnancy in the next year.   They reported they are not interested in discussing contraception today.    No LMP recorded. Patient has had an implant.   Patient has the following medical conditions:   Patient Active Problem List   Diagnosis Date Noted   Overweight 08/01/2020    Chief Complaint  Patient presents with   STD screening    HPI  Patient reports to clinic today with reports of lower abdominal pain for one week.  Patient reports two weeks ago that she had urgency with urination and purchased some OTC medication for urinary tract infection symptoms.  Patient states that urinary urgency has subsided, still reports lower abdominal pain.    Last HIV test per patient/review of record was 08/05/2020 Patient reports last pap was 01/09/2020.   Screening for MPX risk: Does the patient have an unexplained rash? No Is the patient MSM? No Does the patient endorse multiple sex partners or anonymous sex partners? No Did the patient have close or sexual contact with a person diagnosed with MPX? No Has the patient traveled outside the [place] where MPX is endemic? No Is there a high clinical suspicion for MPX-- evidenced by one of the following No  -Unlikely to be chickenpox  -Lymphadenopathy  -Rash that present in same phase of evolution on any given body part See flowsheet for further details and programmatic requirements.    The following portions of the patient's history were reviewed and updated as appropriate: allergies, current medications, past medical history, past social history, past surgical history and problem list.  Objective:  There were no vitals filed for this  visit.  Physical Exam Constitutional:      Appearance: Normal appearance.  HENT:     Head: Normocephalic.     Mouth/Throat:     Comments: No visible dental caries. Broken left wisdom tooth noted.  Pulmonary:     Effort: Pulmonary effort is normal.  Genitourinary:    Comments: External genitalia/pubic area without nits, lice, edema, erythema, lesions and inguinal adenopathy. Vagina with normal mucosa and discharge. Cervix without visible lesions. Sara Richard discharge noted exiting os. Uterus firm, mobile, nt, no masses, no CMT, no adnexal tenderness or fullness. pH 5.5. Musculoskeletal:     Cervical back: Full passive range of motion without pain and normal range of motion.  Neurological:     Mental Status: She is alert.  Psychiatric:        Behavior: Behavior is cooperative.      Assessment and Plan:  Sara Richard is a 30 y.o. female presenting to the [facility] Department for STI screening  1. Screening examination for venereal disease -30 year old female seen today for STD screening. -Patient accepted all screenings including oral, vaginal, rectum CT/GC and bloodwork for HIV/RPR, HEPB/HEP C. Patient meets criteria for HepB screening? Yes. Ordered? Yes Patient meets criteria for HepC screening? Yes. Ordered? Yes  Discussed time line for State Lab results and that patient will be called with positive results and encouraged patient to call if she had not heard in 2 weeks.  Counseled to return or seek care for continued or worsening symptoms Recommended condom use with all sex  Patient  is currently using *Nexplanon to prevent pregnancy.    - WET PREP FOR TRICH, YEAST, CLUE - Chlamydia/Gonorrhea [place] [facility] - Chlamydia/Gonorrhea [place] [facility] - Chlamydia/Gonorrhea [place] [facility] - HIV/HCV [place] [facility] - HBV Antigen/Antibody [facility] - Syphilis Serology, [place] [facility]  2. Cervicitis -Wet mount reviewed, positive clue and amine noted. Please treat patient for  cervicitis.   - doxycycline (VIBRA-TABS) 100 MG tablet; Take 1 tablet (100 mg total) by mouth 2 (two) times daily for 7 days.  Dispense: 14 tablet; Refill: 0    Return if symptoms worsen or fail to improve.  No future appointments.  Glenna Fellows, FNP

## 2021-01-21 ENCOUNTER — Telehealth: Payer: Self-pay | Admitting: Family Medicine

## 2021-01-21 NOTE — Telephone Encounter (Signed)
Pt calling regarding test results

## 2022-01-12 ENCOUNTER — Encounter: Payer: Self-pay | Admitting: Nurse Practitioner

## 2022-01-12 ENCOUNTER — Ambulatory Visit: Payer: Self-pay | Admitting: Nurse Practitioner

## 2022-01-12 DIAGNOSIS — Z113 Encounter for screening for infections with a predominantly sexual mode of transmission: Secondary | ICD-10-CM

## 2022-01-12 DIAGNOSIS — B9689 Other specified bacterial agents as the cause of diseases classified elsewhere: Secondary | ICD-10-CM

## 2022-01-12 DIAGNOSIS — N760 Acute vaginitis: Secondary | ICD-10-CM

## 2022-01-12 LAB — HM HEPATITIS C SCREENING LAB: HM Hepatitis Screen: NEGATIVE

## 2022-01-12 LAB — HM HIV SCREENING LAB: HM HIV Screening: NEGATIVE

## 2022-01-12 LAB — WET PREP FOR TRICH, YEAST, CLUE
Trichomonas Exam: NEGATIVE
Yeast Exam: NEGATIVE

## 2022-01-12 MED ORDER — METRONIDAZOLE 500 MG PO TABS: 500.0000 mg | ORAL_TABLET | Freq: Two times a day (BID) | ORAL | 0 refills | Status: AC

## 2022-01-12 NOTE — Progress Notes (Signed)
Patient here for STI visit.   Wet mount reviewed with provider - standing order to treat patient for BV.   Metronidazole 500mg  BID x 7days dispensed to patient. Education given and questions answered.   , RN

## 2022-01-12 NOTE — Progress Notes (Signed)
[facility] Department  STI clinic/screening visit [address] [place] [address] [phone]  Subjective:  Sara Richard is a 31 y.o. female being seen today for an STI screening visit. The patient reports they do not have symptoms.  Patient reports that they do not desire a pregnancy in the next year.   They reported they are not interested in discussing contraception today.  Patient currently has the Nexplanon as a birth control method.   No LMP recorded (lmp unknown). Patient has had an implant.   Patient has the following medical conditions:   Patient Active Problem List   Diagnosis Date Noted   Overweight 08/01/2020    Chief Complaint  Patient presents with   SEXUALLY TRANSMITTED DISEASE    HPI  Patient reports to clinic today for STD screening.  Patient is asymptomatic.   Does the patient using douching products? No  Last HIV test per patient/review of record was  Lab Results  Component Value Date   HMHIVSCREEN Negative - Validated 01/08/2021    Patient reports last pap was: 1 year ago  Screening for MPX risk: Does the patient have an unexplained rash? No Is the patient MSM? No Does the patient endorse multiple sex partners or anonymous sex partners? Yes Did the patient have close or sexual contact with a person diagnosed with MPX? No Has the patient traveled outside the [place] where MPX is endemic? No Is there a high clinical suspicion for MPX-- evidenced by one of the following No  -Unlikely to be chickenpox  -Lymphadenopathy  -Rash that present in same phase of evolution on any given body part See flowsheet for further details and programmatic requirements.   Immunization history:   There is no immunization history on file for this patient.   The following portions of the patient's history were reviewed and updated as appropriate: allergies, current medications, past medical history, past social history, past surgical history and  problem list.  Objective:  There were no vitals filed for this visit.  Physical Exam Constitutional:      Appearance: Normal appearance.  HENT:     Head: Normocephalic. No abrasion, masses or laceration. Hair is normal.     Right Ear: External ear normal.     Left Ear: External ear normal.     Nose: Nose normal.     Mouth/Throat:     Lips: Pink.     Mouth: Mucous membranes are moist. No oral lesions.     Pharynx: No oropharyngeal exudate or posterior oropharyngeal erythema.     Tonsils: No tonsillar exudate or tonsillar abscesses.  Eyes:     General: Lids are normal.        Right eye: No discharge.        Left eye: No discharge.     Conjunctiva/sclera: Conjunctivae normal.     Right eye: No exudate.    Left eye: No exudate. Pulmonary:     Effort: Pulmonary effort is normal.  Abdominal:     General: Abdomen is flat.     Palpations: Abdomen is soft.     Tenderness: There is no abdominal tenderness. There is no rebound.  Genitourinary:    Pubic Area: No rash or pubic lice.      Labia:        Right: No rash, tenderness, lesion or injury.        Left: No rash, tenderness, lesion or injury.      Vagina: Normal. No vaginal discharge,  erythema or lesions.     Cervix: No cervical motion tenderness, discharge, lesion or erythema.     Uterus: Not enlarged and not tender.      Rectum: Normal.     Comments: Amount Discharge: small  Odor: Yes pH: greater than 4.5 Adheres to vaginal wall: No Color: Sara Richard  Musculoskeletal:     Cervical back: Full passive range of motion without pain, normal range of motion and neck supple.  Lymphadenopathy:     Cervical: No cervical adenopathy.     Right cervical: No superficial, deep or posterior cervical adenopathy.    Left cervical: No superficial, deep or posterior cervical adenopathy.     Upper Body:     Right upper body: No supraclavicular, axillary or epitrochlear adenopathy.     Left upper body: No supraclavicular, axillary or  epitrochlear adenopathy.     Lower Body: No right inguinal adenopathy. No left inguinal adenopathy.  Skin:    General: Skin is warm and dry.     Findings: No lesion or rash.  Neurological:     Mental Status: She is alert and oriented to person, place, and time.  Psychiatric:        Attention and Perception: Attention normal.        Mood and Affect: Mood normal.        Speech: Speech normal.        Behavior: Behavior normal. Behavior is cooperative.      Assessment and Plan:  Sara Richard is a 31 y.o. female presenting to the [facility] Department for STI screening  1. Screening for venereal disease -31 year old female in clinic today for STD screening. -Patient accepted all screenings including oral GC, anal GC, vaginal CT/GC, wet prep and bloodwork for HIV/RPR.  Patient meets criteria for HepB screening? No. Ordered? No - low risk  Patient meets criteria for HepC screening? Yes. Ordered? Yes  Treat wet prep per standing order Discussed time line for State Lab results and that patient will be called with positive results and encouraged patient to call if she had not heard in 2 weeks.  Counseled to return or seek care for continued or worsening symptoms Recommended condom use with all sex  Patient is currently using *Nexplanon to prevent pregnancy.    - HIV/HCV [place] [facility] - Syphilis Serology, [place] [facility] - Chlamydia/Gonorrhea [place] [facility] - WET PREP FOR TRICH, YEAST, CLUE - Gonococcus culture - Gonococcus culture  2. BV (bacterial vaginosis) - Wet prep reviewed, treat patient today for BV.    - metroNIDAZOLE (FLAGYL) 500 MG tablet; Take 1 tablet (500 mg total) by mouth 2 (two) times daily for 7 days.  Dispense: 14 tablet; Refill: 0   Total time spent: 30 minutes   Return if symptoms worsen or fail to improve.  \  Glenna Fellows, FNP

## 2022-01-17 LAB — GONOCOCCUS CULTURE

## 2022-05-21 ENCOUNTER — Encounter: Payer: Self-pay | Admitting: Advanced Practice Midwife

## 2022-05-21 ENCOUNTER — Ambulatory Visit: Payer: Self-pay | Admitting: Advanced Practice Midwife

## 2022-05-21 ENCOUNTER — Encounter (LOCAL_COMMUNITY_HEALTH_CENTER): Payer: Self-pay | Admitting: Advanced Practice Midwife

## 2022-05-21 DIAGNOSIS — F1290 Cannabis use, unspecified, uncomplicated: Secondary | ICD-10-CM

## 2022-05-21 DIAGNOSIS — Z113 Encounter for screening for infections with a predominantly sexual mode of transmission: Secondary | ICD-10-CM

## 2022-05-21 DIAGNOSIS — F17200 Nicotine dependence, unspecified, uncomplicated: Secondary | ICD-10-CM

## 2022-05-21 LAB — WET PREP FOR TRICH, YEAST, CLUE
Trichomonas Exam: NEGATIVE
Yeast Exam: NEGATIVE

## 2022-05-21 LAB — HM HEPATITIS C SCREENING LAB: HM Hepatitis Screen: NEGATIVE

## 2022-05-21 LAB — HM HIV SCREENING LAB: HM HIV Screening: NEGATIVE

## 2022-05-21 NOTE — Progress Notes (Signed)
See other encounter with same date 

## 2022-05-21 NOTE — Progress Notes (Signed)
[facility] Department  STI clinic/screening visit [address] [place] [address] [phone]  Subjective:  KALEEYA HANCOCK is a 32 y.o. SBF smoker G2P1 female being seen today for an STI screening visit. The patient reports they do not have symptoms.  Patient reports that they do not desire a pregnancy in the next year.   They reported they are not interested in discussing contraception today.    Patient's last menstrual period was 05/15/2022 (exact date).  Patient has the following medical conditions:   Patient Active Problem List   Diagnosis Date Noted   Smoker 05/21/2022   Marijuana use 05/21/2022   Overweight 08/01/2020    Chief Complaint  Patient presents with   SEXUALLY TRANSMITTED DISEASE    HPI  Patient reports asymptomatic. LMP 05/15/22. Last sex 05/13/22 without condom; with current partner x 2 years; 2 partners in last 3 mo. Last cig 2023. Last vaped 2 wks ago. Daily cigars and MJ. Last ETOH 05/15/22 (1 shot Petrone) qo weekend. Last pap 01/09/20 neg.  Does the patient using douching products? No  Last HIV test per patient/review of record was  Lab Results  Component Value Date   HMHIVSCREEN Negative - Validated 01/12/2022   No results found for: "HIV" Patient reports last pap was No results found for: "DIAGPAP"  Lab Results  Component Value Date   SPECADGYN Comment 01/09/2020    Screening for MPX risk: Does the patient have an unexplained rash? No Is the patient MSM? No Does the patient endorse multiple sex partners or anonymous sex partners? yes Did the patient have close or sexual contact with a person diagnosed with MPX? No Has the patient traveled outside the [place] where MPX is endemic? No Is there a high clinical suspicion for MPX-- evidenced by one of the following No  -Unlikely to be chickenpox  -Lymphadenopathy  -Rash that present in same phase of evolution on any given body part See flowsheet for further details and  programmatic requirements.   Immunization history:   There is no immunization history on file for this patient.   The following portions of the patient's history were reviewed and updated as appropriate: allergies, current medications, past medical history, past social history, past surgical history and problem list.  Objective:  There were no vitals filed for this visit.  Physical Exam Vitals and nursing note reviewed.  Constitutional:      Appearance: Normal appearance. She is obese.  HENT:     Head: Normocephalic and atraumatic.     Mouth/Throat:     Mouth: Mucous membranes are moist.     Pharynx: Oropharynx is clear. No oropharyngeal exudate or posterior oropharyngeal erythema.  Eyes:     Conjunctiva/sclera: Conjunctivae normal.  Pulmonary:     Effort: Pulmonary effort is normal.  Abdominal:     Palpations: Abdomen is soft. There is no mass.     Tenderness: There is no abdominal tenderness. There is no rebound.     Comments: Soft without masses or tenderness  Genitourinary:    General: Normal vulva.     Exam position: Lithotomy position.     Pubic Area: No rash or pubic lice.      Labia:        Right: No rash or lesion.        Left: No rash or lesion.      Vagina: Vaginal discharge (white creamy leukorrhea, ph<4.5) present. No erythema, bleeding or lesions.     Cervix: Normal.  Uterus: Normal.      Adnexa: Right adnexa normal and left adnexa normal.     Rectum: Normal.     Comments: pH = <4.5 Lymphadenopathy:     Head:     Right side of head: No preauricular or posterior auricular adenopathy.     Left side of head: No preauricular or posterior auricular adenopathy.     Cervical: No cervical adenopathy.     Right cervical: No superficial, deep or posterior cervical adenopathy.    Left cervical: No superficial, deep or posterior cervical adenopathy.     Upper Body:     Right upper body: No supraclavicular, axillary or epitrochlear adenopathy.     Left upper  body: No supraclavicular, axillary or epitrochlear adenopathy.     Lower Body: No right inguinal adenopathy. No left inguinal adenopathy.  Skin:    General: Skin is warm and dry.     Findings: No rash.  Neurological:     Mental Status: She is alert and oriented to person, place, and time.      Assessment and Plan:  HARGUN SPURLING is a 32 y.o. female presenting to the [facility] Department for STI screening  1. Screening examination for venereal disease Treat wet mount per standing orders Immunization nurse consult  - WET PREP FOR TRICH, YEAST, CLUE - Chlamydia/Gonorrhea [place] [facility] - Syphilis Serology, [place] [facility] - HIV/HCV [place] [facility] - Gonococcus culture  2. Smoker Counseled not to smoke via 5 A's   3. Marijuana use Counseled not to use   Patient accepted all screenings including oral, vaginal CT/GC and bloodwork for HIV/RPR, and wet prep. Patient meets criteria for HepB screening? Yes. Ordered? no Patient meets criteria for HepC screening? Yes. Ordered? yes  Treat wet prep per standing order Discussed time line for State Lab results and that patient will be called with positive results and encouraged patient to call if she had not heard in 2 weeks.  Counseled to return or seek care for continued or worsening symptoms Recommended repeat testing in 3 months with positive results. Recommended condom use with all sex  Patient is currently using *Nexplanon to prevent pregnancy.    Return if symptoms worsen or fail to improve.  No future appointments.  Alberteen Spindle, CNM

## 2022-05-21 NOTE — Progress Notes (Signed)
When called to room, RN inquired if given health history form to complete and client responded no. Per client, here today only for an STI appt. States does not want physical today and plans physical in December this year when wants Nexplanon removed. This appt cancelled and STI appt scheduled. Jossie Ng, RN

## 2022-05-21 NOTE — Progress Notes (Signed)
Wet prep reviewed - negative results. Scotty Weigelt, RN  

## 2022-05-26 LAB — GONOCOCCUS CULTURE

## 2022-06-02 ENCOUNTER — Telehealth: Payer: Self-pay

## 2022-06-02 NOTE — Telephone Encounter (Signed)
Pt notified of positive Gonorrhea results.  Treatment appointment made for 5/9 @ 1:30.

## 2022-06-03 ENCOUNTER — Ambulatory Visit: Payer: Self-pay

## 2022-06-03 DIAGNOSIS — A549 Gonococcal infection, unspecified: Secondary | ICD-10-CM

## 2022-06-03 MED ORDER — CEFTRIAXONE SODIUM 500 MG IJ SOLR
500.0000 mg | Freq: Once | INTRAMUSCULAR | Status: AC
Start: 2022-06-03 — End: 2022-06-03
  Administered 2022-06-03: 500 mg via INTRAMUSCULAR

## 2022-06-03 NOTE — Progress Notes (Signed)
Pt is here for treatment of Gonorrhea.   Ceftriaxone 500 mg given IM in rt deltoid.  Pt tolerated well. Pt given condoms, Gonorrhea pamphlet and Ceftriaxone information sheet.  Berdie Ogren, RN

## 2022-07-22 ENCOUNTER — Ambulatory Visit: Payer: Self-pay | Admitting: Family Medicine

## 2022-07-22 DIAGNOSIS — Z1159 Encounter for screening for other viral diseases: Secondary | ICD-10-CM

## 2022-07-22 DIAGNOSIS — Z114 Encounter for screening for human immunodeficiency virus [HIV]: Secondary | ICD-10-CM

## 2022-07-22 DIAGNOSIS — Z708 Other sex counseling: Secondary | ICD-10-CM

## 2022-07-22 DIAGNOSIS — Z113 Encounter for screening for infections with a predominantly sexual mode of transmission: Secondary | ICD-10-CM

## 2022-07-22 LAB — HEPATITIS B SURFACE ANTIGEN: Hepatitis B Surface Ag: NONREACTIVE

## 2022-07-22 LAB — HM HEPATITIS C SCREENING LAB: HM Hepatitis Screen: NEGATIVE

## 2022-07-22 LAB — HM HIV SCREENING LAB: HM HIV Screening: NEGATIVE

## 2022-07-22 NOTE — Progress Notes (Signed)
STI clinic/screening visit [address] [place] [address] [phone]  Subjective:  Sara Richard is a 32 y.o. female being seen today for [facility]. The patient reports they do not have symptoms.    Patient's last menstrual period was 07/05/2022 (exact date).  Patient has the following medical conditions:   Patient Active Problem List   Diagnosis Date Noted   Smoker 05/21/2022   Marijuana use 05/21/2022   Overweight 08/01/2020    Chief Complaint  Patient presents with   SEXUALLY TRANSMITTED DISEASE    No symptoms     Does the patient using douching products? No  Last HIV test per patient/review of record was  Lab Results  Component Value Date   HMHIVSCREEN Negative - Validated 05/21/2022   No results found for: "HIV" Patient reports last pap was No results found for: "DIAGPAP"  Lab Results  Component Value Date   SPECADGYN Comment 01/09/2020    Screening for MPX risk: Does the patient have an unexplained rash? No Is the patient MSM? No Does the patient endorse multiple sex partners or anonymous sex partners? No Did the patient have close or sexual contact with a person diagnosed with MPX? No Has the patient traveled outside the [place] where MPX is endemic? No Is there a high clinical suspicion for MPX-- evidenced by one of the following No  -Unlikely to be chickenpox  -Lymphadenopathy  -Rash that present in same phase of evolution on any given body part See flowsheet for further details and programmatic requirements.   Immunization history:   There is no immunization history on file for this patient.   The following portions of the patient's history were reviewed and updated as appropriate: allergies, current medications, past medical history, past social history, past surgical history and problem list.  Objective:  There were no vitals filed for this visit.  Patient seen by RN only. Self collected swabs.    Assessment and  Plan:  Sara Richard is a 32 y.o. female presenting to the [facility] Department for STI screening in [facility]  1. Screen for sexually transmitted diseases  - Chlamydia/Gonorrhea [place] [facility] - HIV/HCV [place] [facility] - Hepatitis Serology, [place] [facility] - Chlamydia/Gonorrhea [place] [facility] - Syphilis Serology, [place] [facility]   Patient accepted all screenings including oral, vaginal CT/GC and bloodwork for HIV/RPR. Patient meets criteria for HepB screening? Yes. Ordered? yes Patient meets criteria for HepC screening? Yes. Ordered? yes  Treat positive results per standing order.  Discussed time line for State Lab results and that patient will be called with positive results and encouraged patient to call if she had not heard in 2 weeks.  Recommended repeat testing in 3 months with positive results. Recommended condom use with all sex  Patient is currently using *Nexplanon to prevent pregnancy.    No follow-ups on file.  No future appointments.  Gaspar Garbe, RN

## 2022-07-23 NOTE — Progress Notes (Signed)
Attestation of [facility]: Evaluation and management procedures were performed by the Registered Nurse under [facility] Department standing orders.  RN independently saw the patient and provided screenings for them without medical exam. Patient self collected specimens. I have reviewed the RN's note and chart, and I agree with screening ordered.   Lenice Llamas, [place]

## 2022-08-06 ENCOUNTER — Telehealth: Payer: Self-pay

## 2022-08-06 NOTE — Telephone Encounter (Signed)
LM for pt to return my call

## 2022-08-10 ENCOUNTER — Telehealth: Payer: Self-pay

## 2022-08-10 NOTE — Telephone Encounter (Signed)
No additional notes

## 2022-08-13 ENCOUNTER — Telehealth: Payer: Self-pay | Admitting: Family Medicine

## 2022-08-13 NOTE — Telephone Encounter (Signed)
PT IS RETURNING A MISSED CALL FROM THE 16TH

## 2022-08-13 NOTE — Telephone Encounter (Signed)
Pt notified of positive Gonorrhea results.  Treatment appointment made for 7/25 @ 1:30.  Berdie Ogren, RN

## 2022-08-13 NOTE — Telephone Encounter (Signed)
Pt is returning a missed call about her results

## 2022-08-19 ENCOUNTER — Ambulatory Visit: Payer: Self-pay

## 2022-08-19 DIAGNOSIS — A549 Gonococcal infection, unspecified: Secondary | ICD-10-CM

## 2022-08-19 MED ORDER — CEFTRIAXONE SODIUM 500 MG IJ SOLR
500.0000 mg | Freq: Once | INTRAMUSCULAR | Status: AC
Start: 2022-08-19 — End: 2022-08-19
  Administered 2022-08-19: 500 mg via INTRAMUSCULAR

## 2022-08-19 NOTE — Progress Notes (Signed)
Pt is here for treatment of Gonorrhea.   Ceftriaxone 500 mg given IM in Rt deltoid.  Pt tolerated well Condoms given.

## 2022-09-29 ENCOUNTER — Ambulatory Visit: Payer: Self-pay | Admitting: Family Medicine

## 2022-09-29 DIAGNOSIS — Z113 Encounter for screening for infections with a predominantly sexual mode of transmission: Secondary | ICD-10-CM

## 2022-09-29 DIAGNOSIS — B9689 Other specified bacterial agents as the cause of diseases classified elsewhere: Secondary | ICD-10-CM

## 2022-09-29 DIAGNOSIS — N760 Acute vaginitis: Secondary | ICD-10-CM

## 2022-09-29 LAB — WET PREP FOR TRICH, YEAST, CLUE
Trichomonas Exam: NEGATIVE
Yeast Exam: NEGATIVE

## 2022-09-29 LAB — HM HIV SCREENING LAB: HM HIV Screening: NEGATIVE

## 2022-09-29 MED ORDER — METRONIDAZOLE 500 MG PO TABS: 500.0000 mg | ORAL_TABLET | Freq: Two times a day (BID) | ORAL | Status: AC

## 2022-09-29 NOTE — Progress Notes (Signed)
[facility] Department  STI clinic/screening visit [address] [place] [address] [phone]  Subjective:  Sara Richard is a 32 y.o. female being seen today for an STI screening visit. The patient reports they do not have symptoms.  Patient reports that they do not desire a pregnancy in the next year.   They reported they are not interested in discussing contraception today.    No LMP recorded. Patient has had an implant.  Patient has the following medical conditions:   Patient Active Problem List   Diagnosis Date Noted   Smoker 05/21/2022   Marijuana use 05/21/2022   Overweight 08/01/2020    Chief Complaint  Patient presents with   SEXUALLY TRANSMITTED DISEASE    Screening    HPI  Patient reports to clinic for repeat testing after treatment for oral gonorrhea in July. Denies symptoms today  Does the patient using douching products? No  Last HIV test per patient/review of record was  Lab Results  Component Value Date   HMHIVSCREEN Negative - Validated 07/22/2022   No results found for: "HIV" Patient reports last pap was No results found for: "DIAGPAP"  Lab Results  Component Value Date   SPECADGYN Comment 01/09/2020    Screening for MPX risk: Does the patient have an unexplained rash? No Is the patient MSM? No Does the patient endorse multiple sex partners or anonymous sex partners? No Did the patient have close or sexual contact with a person diagnosed with MPX? No Has the patient traveled outside the [place] where MPX is endemic? No Is there a high clinical suspicion for MPX-- evidenced by one of the following No  -Unlikely to be chickenpox  -Lymphadenopathy  -Rash that present in same phase of evolution on any given body part See flowsheet for further details and programmatic requirements.   Immunization history:   There is no immunization history on file for this patient.   The following portions of the patient's history were  reviewed and updated as appropriate: allergies, current medications, past medical history, past social history, past surgical history and problem list.  Objective:  There were no vitals filed for this visit.  Physical Exam Vitals and nursing note reviewed.  Constitutional:      Appearance: Normal appearance.  HENT:     Head: Normocephalic and atraumatic.     Mouth/Throat:     Mouth: Mucous membranes are moist.     Pharynx: Oropharynx is clear. No oropharyngeal exudate or posterior oropharyngeal erythema.  Pulmonary:     Effort: Pulmonary effort is normal.  Abdominal:     General: Abdomen is flat.     Palpations: There is no mass.     Tenderness: There is no abdominal tenderness. There is no rebound.  Genitourinary:    General: Normal vulva.     Exam position: Lithotomy position.     Pubic Area: No rash or pubic lice.      Labia:        Right: No rash or lesion.        Left: No rash or lesion.      Vagina: Vaginal discharge present. No erythema, bleeding or lesions.     Cervix: Friability present. No cervical motion tenderness, discharge, lesion or erythema.     Uterus: Normal.      Adnexa: Right adnexa normal and left adnexa normal.     Rectum: Normal.     Comments: pH = 4-5  Moderate amt of white frothy discharge  present Lymphadenopathy:     Head:     Right side of head: No preauricular or posterior auricular adenopathy.     Left side of head: No preauricular or posterior auricular adenopathy.     Cervical: No cervical adenopathy.     Upper Body:     Right upper body: No supraclavicular, axillary or epitrochlear adenopathy.     Left upper body: No supraclavicular, axillary or epitrochlear adenopathy.     Lower Body: No right inguinal adenopathy. No left inguinal adenopathy.  Skin:    General: Skin is warm and dry.     Findings: No rash.  Neurological:     Mental Status: She is alert and oriented to person, place, and time.      Assessment and Plan:  Sara Richard is a 32 y.o. female presenting to the [facility] Department for STI screening  1. Screening for venereal disease  - HIV [place] [facility] - Syphilis Serology, [place] [facility] - WET PREP FOR TRICH, YEAST, CLUE - Chlamydia/Gonorrhea [place] [facility] - Chlamydia/Gonorrhea  Lab   Patient accepted all screenings including oral, vaginal CT/GC and bloodwork for HIV/RPR, and wet prep. Patient meets criteria for HepB screening? No. Ordered? not applicable Patient meets criteria for HepC screening? No. Ordered? not applicable  Treat wet prep per standing order Discussed time line for State Lab results and that patient will be called with positive results and encouraged patient to call if she had not heard in 2 weeks.  Counseled to return or seek care for continued or worsening symptoms Recommended repeat testing in 3 months with positive results. Recommended condom use with all sex  Patient is currently using *Nexplanon to prevent pregnancy.    Return in about 3 months (around 12/29/2022), or if symptoms worsen or fail to improve, for STI screening.  No future appointments. Total time spent 30 minutes   Lenice Llamas, [place]

## 2022-09-29 NOTE — Progress Notes (Signed)
Pt is here for STD screening.  Wet mount results reviewed.  The patient was dispensed Metronidazole 500 mg #14 today. I provided counseling today regarding the medication. We discussed the medication, the side effects and when to call clinic. Patient given the opportunity to ask questions. Questions answered.  Berdie Ogren, RN

## 2022-10-15 ENCOUNTER — Telehealth: Payer: Self-pay | Admitting: General Practice

## 2022-10-15 NOTE — Telephone Encounter (Signed)
Pt wants to know sti results since she is unable to see it in [place]

## 2022-10-15 NOTE — Telephone Encounter (Signed)
Pt notified of negative GC/CT, Syphilis and HIV results.  Berdie Ogren, RN

## 2023-01-05 ENCOUNTER — Ambulatory Visit: Payer: Self-pay | Admitting: Advanced Practice Midwife

## 2023-01-05 ENCOUNTER — Encounter: Payer: Self-pay | Admitting: Advanced Practice Midwife

## 2023-01-05 DIAGNOSIS — N760 Acute vaginitis: Secondary | ICD-10-CM

## 2023-01-05 DIAGNOSIS — Z113 Encounter for screening for infections with a predominantly sexual mode of transmission: Secondary | ICD-10-CM

## 2023-01-05 DIAGNOSIS — B9689 Other specified bacterial agents as the cause of diseases classified elsewhere: Secondary | ICD-10-CM

## 2023-01-05 LAB — WET PREP FOR TRICH, YEAST, CLUE
Trichomonas Exam: NEGATIVE
Yeast Exam: NEGATIVE

## 2023-01-05 MED ORDER — METRONIDAZOLE 500 MG PO TABS
500.0000 mg | ORAL_TABLET | Freq: Two times a day (BID) | ORAL | Status: AC
Start: 2023-01-05 — End: 2023-01-12

## 2023-01-05 NOTE — Progress Notes (Signed)
[facility] Department  STI clinic/screening visit [address] [place] [address] [phone]  Subjective:  KAYRENE PIGNATELLI is a 32 y.o. SBF G2P1 smoker female being seen today for an STI screening visit. The patient reports they do not have symptoms.  Patient reports that they do not desire a pregnancy in the next year.   They reported they are not interested in discussing contraception today.    No LMP recorded. Patient has had an implant.  Patient has the following medical conditions:   Patient Active Problem List   Diagnosis Date Noted   Smoker 05/21/2022   Marijuana use 05/21/2022   Overweight 08/01/2020    Chief Complaint  Patient presents with   SEXUALLY TRANSMITTED DISEASE    Pt is here for STD screening and rash at the left arm    HPI  Patient reports asymptomatic except has a rash under her left bicep that itches and has been applying hydrocortisone 1x/day and uses Dial soap and bodyshower 1-2x/day and bathes in hot water. LMP 12/29/22. Last sex 01/04/23 without condom; with current partner x 1 year; 1 partner in last 3 mo. Last cig 2022. Last vaped 11/2022.  Last MJ yesterday. Last ETOH yesterday (1 shot). Nexplanon inserted 3 years ago. Last pap 01/09/20 neg.   Does the patient using douching products? No  Last HIV test per patient/review of record was  Lab Results  Component Value Date   HMHIVSCREEN Negative - Validated 09/29/2022   No results found for: "HIV"   Last HEPC test per patient/review of record was  Lab Results  Component Value Date   HMHEPCSCREEN Negative-Validated 07/22/2022   No components found for: "HEPC"   Last HEPB test per patient/review of record was No components found for: "HMHEPBSCREEN" No components found for: "HEPC"   Patient reports last pap was No results found for: "DIAGPAP"  Lab Results  Component Value Date   SPECADGYN Comment 01/09/2020    Screening for MPX risk: Does the patient have an  unexplained rash? Yes Is the patient MSM? No Does the patient endorse multiple sex partners or anonymous sex partners? No Did the patient have close or sexual contact with a person diagnosed with MPX? No Has the patient traveled outside the [place] where MPX is endemic? No Is there a high clinical suspicion for MPX-- evidenced by one of the following No  -Unlikely to be chickenpox  -Lymphadenopathy  -Rash that present in same phase of evolution on any given body part See flowsheet for further details and programmatic requirements.   Immunization history:   There is no immunization history on file for this patient.   The following portions of the patient's history were reviewed and updated as appropriate: allergies, current medications, past medical history, past social history, past surgical history and problem list.  Objective:  There were no vitals filed for this visit. Pt declines need for chaperone Physical Exam Vitals and nursing note reviewed.  Constitutional:      Appearance: Normal appearance.  HENT:     Head: Normocephalic and atraumatic.     Mouth/Throat:     Mouth: Mucous membranes are moist.     Pharynx: Oropharynx is clear. No oropharyngeal exudate or posterior oropharyngeal erythema.  Eyes:     Conjunctiva/sclera: Conjunctivae normal.  Pulmonary:     Effort: Pulmonary effort is normal.  Abdominal:     Palpations: Abdomen is soft. There is no mass.     Tenderness: There is no abdominal  tenderness. There is no rebound.     Comments: Soft without masses or tenderness, fair tone  Genitourinary:    General: Normal vulva.     Exam position: Lithotomy position.     Pubic Area: No rash or pubic lice.      Labia:        Right: No rash or lesion.        Left: No rash or lesion.      Vagina: Vaginal discharge (white creamy leukorrhea, ph<4.5) present. No erythema, bleeding or lesions.     Cervix: Normal. No cervical motion tenderness, discharge, friability, lesion or  erythema.     Uterus: Normal.      Adnexa: Right adnexa normal and left adnexa normal.     Rectum: Normal.     Comments: pH = <4.5 Lymphadenopathy:     Head:     Right side of head: No preauricular or posterior auricular adenopathy.     Left side of head: No preauricular or posterior auricular adenopathy.     Cervical: No cervical adenopathy.     Right cervical: No superficial, deep or posterior cervical adenopathy.    Left cervical: No superficial, deep or posterior cervical adenopathy.     Upper Body:     Right upper body: No supraclavicular, axillary or epitrochlear adenopathy.     Left upper body: No supraclavicular, axillary or epitrochlear adenopathy.     Lower Body: No right inguinal adenopathy. No left inguinal adenopathy.  Skin:    General: Skin is warm and dry.     Findings: No rash.  Neurological:     Mental Status: She is alert and oriented to person, place, and time.     Assessment and Plan:  NORELI FREYMAN is a 32 y.o. female presenting to the [facility] Department for STI screening  1. Screening examination for venereal disease Treat wet mount per standing orders Immunization nurse consult Pt counseled not to wash bicep with soap and use tepid water to bathe (not hot water), needs derm consult if not resolving or worsening sxs  - WET PREP FOR TRICH, YEAST, CLUE - Chlamydia/Gonorrhea [place] [facility] - Gonococcus culture   Patient accepted all screenings including oral, vaginal CT/GC and bloodwork for HIV/RPR, and wet prep. Patient meets criteria for HepB screening? Yes. Ordered? Pt declines Patient meets criteria for HepC screening? Yes. Ordered? Pt declines  Treat wet prep per standing order Discussed time line for State Lab results and that patient will be called with positive results and encouraged patient to call if she had not heard in 2 weeks.  Counseled to return or seek care for continued or worsening symptoms Recommended repeat testing in 3  months with positive results. Recommended condom use with all sex  Patient is currently using *Nexplanon to prevent pregnancy.    No follow-ups on file.  Future Appointments  Date Time Provider Department Center  01/17/2023  2:15 PM Dominic, Courtney Heys, CNM [facility] None    Alberteen Spindle, CNM

## 2023-01-05 NOTE — Progress Notes (Signed)
Pt is here for STD visit.  Wet prep results reviewed with pt, treatment required per standing order.  Condoms declined.   The patient was dispensed metronidazole today. I provided counseling today regarding the medication. We discussed the medication, the side effects and when to call clinic. Patient given the opportunity to ask questions. Questions answered.  Gaspar Garbe, RN

## 2023-01-06 ENCOUNTER — Ambulatory Visit: Payer: Self-pay

## 2023-01-12 LAB — GONOCOCCUS CULTURE

## 2023-01-13 ENCOUNTER — Telehealth: Payer: Self-pay

## 2023-01-13 NOTE — Telephone Encounter (Signed)
PW verified.  Pt notified of positive STD results.  Treatment appointment made for 01/14/23 @ 1:00 pm.  Berdie Ogren, RN

## 2023-01-14 ENCOUNTER — Ambulatory Visit: Payer: Self-pay

## 2023-01-14 DIAGNOSIS — A549 Gonococcal infection, unspecified: Secondary | ICD-10-CM

## 2023-01-14 MED ORDER — CEFTRIAXONE SODIUM 500 MG IJ SOLR
500.0000 mg | Freq: Once | INTRAMUSCULAR | Status: AC
Start: 2023-01-14 — End: 2023-01-14
  Administered 2023-01-14: 500 mg via INTRAMUSCULAR

## 2023-01-14 NOTE — Progress Notes (Signed)
Pt is here for treatment of Gonorrhea.   LMP was 12/29/2022.  Pt has Nexplanon as her form of birth control Ceftriaxone 500 mg given IM.  Pt tolerated well.  Pt monitored for 10 minutes. Condoms given.  Berdie Ogren, RN

## 2023-01-17 ENCOUNTER — Encounter: Payer: Medicaid Other | Admitting: Licensed Practical Nurse

## 2023-01-20 ENCOUNTER — Ambulatory Visit
Admission: RE | Admit: 2023-01-20 | Discharge: 2023-01-20 | Disposition: A | Discharge status: Home / Self Care | Payer: Medicaid Other | Source: Ambulatory Visit | Attending: Physician Assistant | Admitting: Physician Assistant

## 2023-01-20 VITALS — BP 120/78 | HR 91 | Temp 98.4°F | Resp 18

## 2023-01-20 DIAGNOSIS — R300 Dysuria: Secondary | ICD-10-CM | POA: Diagnosis not present

## 2023-01-20 DIAGNOSIS — R21 Rash and other nonspecific skin eruption: Secondary | ICD-10-CM

## 2023-01-20 DIAGNOSIS — N760 Acute vaginitis: Secondary | ICD-10-CM

## 2023-01-20 LAB — URINALYSIS, ROUTINE W REFLEX MICROSCOPIC
Bilirubin Urine: NEGATIVE
Glucose, UA: NEGATIVE mg/dL
Hgb urine dipstick: NEGATIVE
Ketones, ur: NEGATIVE mg/dL
Nitrite: NEGATIVE
Protein, ur: NEGATIVE mg/dL
Specific Gravity, Urine: 1.025 (ref 1.005–1.030)
pH: 6.5 (ref 5.0–8.0)

## 2023-01-20 LAB — WET PREP, GENITAL
Sperm: NONE SEEN
Trich, Wet Prep: NONE SEEN
WBC, Wet Prep HPF POC: >10 — AB (ref <10)

## 2023-01-20 LAB — URINALYSIS, MICROSCOPIC (REFLEX)

## 2023-01-20 LAB — PREGNANCY, URINE: Preg Test, Ur: NEGATIVE

## 2023-01-20 MED ORDER — CLOTRIMAZOLE-BETAMETHASONE 1-0.05 % EX CREA: TOPICAL_CREAM | CUTANEOUS | 0 refills | Status: DC

## 2023-01-20 MED ORDER — FLUCONAZOLE 150 MG PO TABS: ORAL_TABLET | ORAL | 0 refills | Status: DC

## 2023-01-20 MED ORDER — METRONIDAZOLE 500 MG PO TABS: 500.0000 mg | ORAL_TABLET | Freq: Two times a day (BID) | ORAL | 0 refills | Status: AC

## 2023-01-20 NOTE — ED Triage Notes (Signed)
Pt c/o dysuria and she noticed blood on the tissue but she is unsure where it came from. She also states her vaginal area feels swollen.

## 2023-01-20 NOTE — Discharge Instructions (Signed)
-  You have bacterial vaginosis and yeast infection.  I sent metronidazole for BV and Diflucan for yeast infection. - We will call you and send antibiotics if your urine culture is positive for bacteria.  Increase rest and fluids. - Repeat your STI testing in about 6 weeks. - I sent a topical cream for your rash.

## 2023-01-20 NOTE — ED Provider Notes (Signed)
[facility]    CSN: [id number] Arrival date & time: 01/20/23  1650      History   Chief Complaint Chief Complaint  Patient presents with   Dysuria   Hematuria    HPI Sara Richard is a 32 y.o. female presenting for dysuria, vulvar itching and swelling since yesterday.  Reports burning toward the end of urination.  She has also noticed thick white clumpy discharge.  Denies pelvic pain, abdominal pain, flank pain, fever, fatigue.  She reports she had a BM a few days ago and when she wiped she saw a little bit of blood in the toilet paper.  She does not know the source of the bleeding.  Has not had it since.  Patient tested positive for gonorrhea on 01/05/2023.  She was treated with Rocephin injection 6 days ago.  Reports her partner was also treated for gonorrhea before she was.  Reports they had sexual intercourse yesterday and did not use a condom.  Patient is also reporting an itchy rash of the left upper arm.  HPI  Past Medical History:  Diagnosis Date   History of urinary tract infection     Patient Active Problem List   Diagnosis Date Noted   Smoker 05/21/2022   Marijuana use 05/21/2022   Overweight 08/01/2020    Past Surgical History:  Procedure Laterality Date   Denies surgical hx      OB History     Gravida  2   Para  1   Term  1   Preterm      AB  1   Living         SAB      IAB      Ectopic      Multiple      Live Births               Home Medications    Prior to Admission medications   Medication Sig Start Date End Date Taking? Authorizing Provider  clotrimazole-betamethasone (LOTRISONE) cream Apply to affected area 2 times daily prn 01/20/23  Yes Shirlee Latch, PA-C  fluconazole (DIFLUCAN) 150 MG tablet Take 1 tab po q72 hr 01/20/23  Yes Eusebio Friendly B, PA-C  metroNIDAZOLE (FLAGYL) 500 MG tablet Take 1 tablet (500 mg total) by mouth 2 (two) times daily for 7 days. 01/20/23 01/27/23 Yes Shirlee Latch, PA-C     Family History Family History  Problem Relation Age of Onset   CVA Maternal Grandfather    Diabetes Father     Social History Social History   Tobacco Use   Smoking status: Former    Types: Cigars, Cigarettes, E-cigarettes    Passive exposure: Current   Smokeless tobacco: Never   Tobacco comments:    One Black and Mild cigar daily (sometimes less). No longer smoking cigarettes.     05/21/22:  Reports smoking 1 - 2 cigars daily (not Black and Milds at this time). Denies current use of cigarettes.  Vaping Use   Vaping status: Some Days  Substance Use Topics   Alcohol use: Yes    Alcohol/week: 1.0 standard drink of alcohol    Types: 1 Shots of liquor per week    Comment: last use 01/04/23   Drug use: Yes    Types: Marijuana    Comment: daily     Allergies   Patient has no known allergies.   Review of Systems Review of Systems  Constitutional:  Negative for chills,  fatigue and fever.  Gastrointestinal:  Negative for abdominal pain, diarrhea, nausea and vomiting.  Genitourinary:  Positive for dysuria and vaginal discharge. Negative for decreased urine volume, flank pain, frequency, hematuria, pelvic pain, urgency and vaginal pain.  Musculoskeletal:  Negative for back pain.  Skin:  Negative for rash.     Physical Exam Triage Vital Signs ED Triage Vitals  Encounter Vitals Group     BP 01/20/23 1748 120/78     Systolic BP Percentile --      Diastolic BP Percentile --      Pulse Rate 01/20/23 1748 91     Resp 01/20/23 1748 18     Temp 01/20/23 1748 98.4 F (36.9 C)     Temp Source 01/20/23 1748 Oral     SpO2 01/20/23 1748 96 %     Weight --      Height --      Head Circumference --      Peak Flow --      Pain Score 01/20/23 1747 0     Pain Loc --      Pain Education --      Exclude from Growth Chart --    No data found.  Updated Vital Signs BP 120/78 (BP Location: Right Arm)   Pulse 91   Temp 98.4 F (36.9 C) (Oral)   Resp 18   LMP 12/29/2022  (Exact Date)   SpO2 96%      Physical Exam Vitals and nursing note reviewed.  Constitutional:      General: She is not in acute distress.    Appearance: Normal appearance. She is not ill-appearing or toxic-appearing.  HENT:     Head: Normocephalic and atraumatic.     Nose: Nose normal.     Mouth/Throat:     Mouth: Mucous membranes are moist.     Pharynx: Oropharynx is clear.  Eyes:     General: No scleral icterus.       Right eye: No discharge.        Left eye: No discharge.     Conjunctiva/sclera: Conjunctivae normal.  Cardiovascular:     Rate and Rhythm: Normal rate and regular rhythm.     Heart sounds: Normal heart sounds.  Pulmonary:     Effort: Pulmonary effort is normal. No respiratory distress.     Breath sounds: Normal breath sounds.  Abdominal:     Palpations: Abdomen is soft.     Tenderness: There is no abdominal tenderness. There is no right CVA tenderness or left CVA tenderness.  Musculoskeletal:     Cervical back: Neck supple.  Skin:    General: Skin is dry.     Findings: Rash (hyperpigmented macular rash left upper posterior arm) present.  Neurological:     General: No focal deficit present.     Mental Status: She is alert. Mental status is at baseline.     Motor: No weakness.     Gait: Gait normal.  Psychiatric:        Mood and Affect: Mood normal.        Behavior: Behavior normal.      UC Treatments / Results  Labs (all labs ordered are listed, but only abnormal results are displayed) Labs Reviewed  WET PREP, GENITAL - Abnormal; Notable for the following components:      Result Value   Yeast Wet Prep HPF POC PRESENT (*)    Clue Cells Wet Prep HPF POC PRESENT (*)    WBC, Wet  Prep HPF POC >10 (*)    All other components within normal limits  URINALYSIS, ROUTINE W REFLEX MICROSCOPIC - Abnormal; Notable for the following components:   APPearance HAZY (*)    Leukocytes,Ua SMALL (*)    All other components within normal limits  URINALYSIS,  MICROSCOPIC (REFLEX) - Abnormal; Notable for the following components:   Bacteria, UA FEW (*)    All other components within normal limits  URINE CULTURE  PREGNANCY, URINE    EKG   Radiology No results found.  Procedures Procedures (including critical care time)  Medications Ordered in UC Medications - No data to display  Initial Impression / Assessment and Plan / UC Course  I have reviewed the triage vital signs and the nursing notes.  Pertinent labs & imaging results that were available during my care of the patient were reviewed by me and considered in my medical decision making (see chart for details).   32 year old female presents for dysuria, vaginal discharge since yesterday.  History of gonorrhea earlier this month.  Received Rocephin injection 6 days ago.  Partner was also treated.  They had sexual intercourse yesterday and did not use condom.  Vitals are stable and normal.  No abdominal tenderness or CVA tenderness.  Patient elects to perform vaginal self swab for wet prep.  Urinalysis and urine pregnancy also obtained.  Wet prep positive for clue cells and yeast. Urinalysis shows small leukocytes but this is not a clean-catch.  Will send urine for culture and treat for UTI if culture is positive.  Sent metronidazole and Diflucan for vaginal infections.  Advised repeating STI screening in about 6 weeks.  Use protection.  Will treat patient's rash which could be fungal or contact dermatitis with Lotrisone.   Final Clinical Impressions(s) / UC Diagnoses   Final diagnoses:  Acute vaginitis  Dysuria  Rash and nonspecific skin eruption     Discharge Instructions      -You have bacterial vaginosis and yeast infection.  I sent metronidazole for BV and Diflucan for yeast infection. - We will call you and send antibiotics if your urine culture is positive for bacteria.  Increase rest and fluids. - Repeat your STI testing in about 6 weeks. - I sent a topical cream  for your rash.     ED Prescriptions     Medication Sig Dispense Auth. Provider   fluconazole (DIFLUCAN) 150 MG tablet Take 1 tab po q72 hr 3 tablet Eusebio Friendly B, PA-C   metroNIDAZOLE (FLAGYL) 500 MG tablet Take 1 tablet (500 mg total) by mouth 2 (two) times daily for 7 days. 14 tablet Eusebio Friendly B, PA-C   clotrimazole-betamethasone (LOTRISONE) cream Apply to affected area 2 times daily prn 30 g Shirlee Latch, [place]      PDMP not reviewed this encounter.   Shirlee Latch, PA-C 01/20/23 [phone]

## 2023-01-21 ENCOUNTER — Encounter: Payer: Self-pay | Admitting: Licensed Practical Nurse

## 2023-01-21 LAB — URINE CULTURE

## 2023-01-24 ENCOUNTER — Telehealth (HOSPITAL_COMMUNITY): Payer: Self-pay

## 2023-01-24 NOTE — Telephone Encounter (Signed)
Pt returned call. Questions answered. Verbalized understanding.

## 2023-01-24 NOTE — Telephone Encounter (Signed)
Pt LVM inquiring about recent visit/test results. Attempted to reach patient x1. LVM.

## 2023-02-09 ENCOUNTER — Ambulatory Visit: Payer: Self-pay

## 2023-02-09 ENCOUNTER — Ambulatory Visit
Admission: RE | Admit: 2023-02-09 | Discharge: 2023-02-09 | Disposition: A | Discharge status: Home / Self Care | Payer: Medicaid Other | Source: Ambulatory Visit | Attending: Emergency Medicine | Admitting: Emergency Medicine

## 2023-02-09 VITALS — BP 121/74 | HR 78 | Temp 98.3°F | Resp 19

## 2023-02-09 DIAGNOSIS — Z113 Encounter for screening for infections with a predominantly sexual mode of transmission: Secondary | ICD-10-CM | POA: Diagnosis not present

## 2023-02-09 DIAGNOSIS — N760 Acute vaginitis: Secondary | ICD-10-CM

## 2023-02-09 DIAGNOSIS — B3731 Acute candidiasis of vulva and vagina: Secondary | ICD-10-CM

## 2023-02-09 DIAGNOSIS — B9689 Other specified bacterial agents as the cause of diseases classified elsewhere: Secondary | ICD-10-CM | POA: Diagnosis present

## 2023-02-09 LAB — WET PREP, GENITAL
Sperm: NONE SEEN
Trich, Wet Prep: NONE SEEN
WBC, Wet Prep HPF POC: <10 — AB (ref <10)

## 2023-02-09 MED ORDER — METRONIDAZOLE 500 MG PO TABS: 500.0000 mg | ORAL_TABLET | Freq: Two times a day (BID) | ORAL | 0 refills | Status: AC

## 2023-02-09 MED ORDER — FLUCONAZOLE 150 MG PO TABS: 150.0000 mg | ORAL_TABLET | ORAL | 0 refills | Status: AC

## 2023-02-09 NOTE — Discharge Instructions (Addendum)
 Take the Flagyl  (metronidazole ) 500 mg twice daily for treatment of your bacterial vaginosis.  Avoid alcohol while on the metronidazole  as taken together will cause of vomiting.  Bacterial vaginosis is often caused by a imbalance of bacteria in your vaginal vault.  This is sometimes a result of using tampons or hormonal fluctuations during her menstrual cycle.  You if your symptoms are recurrent you can try using a boric acid suppository twice weekly to help maintain the acid-base balance in your vagina vault which could prevent further infection.  You can also try vaginal probiotics to help return normal bacterial balance.   For your vaginal yeast infection take 1 Diflucan  tablet now and repeat dosing every 3 days for total of 3 doses.  Your testing for gonorrhea and chlamydia will be back in the next 2 days.  If you test positive for either infection you will be contacted by phone and treatment options will be provided.  If the results are negative they will appear in your MyChart.  Avoid intercourse, or any unprotected intercourse, until after you have received your test results.

## 2023-02-09 NOTE — ED Provider Notes (Signed)
 [facility]    CSN: [id number] Arrival date & time: 02/09/23  1356      History   Chief Complaint Chief Complaint  Patient presents with   Exposure to STD    Need to be retested for sti - Entered by patient    HPI Sara Richard is a 33 y.o. female.   HPI  33 year old female with past medical history of gonorrhea presents with request for STI testing.  She is reporting back pain, white mucus in her stool, swelling in her vaginal area with a white vaginal discharge but no itching.  She was previously seen in this urgent care on 01/20/2023 with similar complaints though at that time also had urinary discomfort.  She tested positive for gonorrhea on 01/05/2023 and was treated with an injection of Rocephin  on 01/14/2023.  Her partner had been treated for gonorrhea prior to her treatment.  The day of her last visit she had had unprotected intercourse with her partner.  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 Authorizing Provider  fluconazole  (DIFLUCAN ) 150 MG tablet Take 1 tablet (150 mg total) by mouth every 3 (three) days for 3 doses. 02/09/23 02/16/23 Yes Kent Pear, NP  metroNIDAZOLE  (FLAGYL ) 500 MG tablet Take 1 tablet (500 mg total) by mouth 2 (two) times daily. 02/09/23  Yes Kent Pear, NP    Family History Family History  Problem Relation Age of Onset   CVA Maternal Grandfather    Diabetes Father     Social History Social History    Tobacco Use   Smoking status: Former    Types: Cigars, Cigarettes, E-cigarettes    Passive exposure: Current   Smokeless tobacco: Never   Tobacco comments:    One Black and Mild cigar daily (sometimes less). No longer smoking cigarettes.     05/21/22:  Reports smoking 1 - 2 cigars daily (not Black and Milds at this time). Denies current use of cigarettes.  Vaping Use   Vaping status: Some Days  Substance Use Topics   Alcohol use: Yes    Alcohol/week: 1.0 standard drink of alcohol    Types: 1 Shots of liquor per week    Comment: last use 01/04/23   Drug use: Yes    Types: Marijuana    Comment: daily     Allergies   Patient has no known allergies.   Review of Systems Review of Systems  Gastrointestinal:  Positive for constipation. Negative for abdominal pain, blood in stool, nausea and vomiting.  Genitourinary:  Positive for vaginal discharge. Negative for dysuria, frequency, hematuria, urgency and vaginal pain.  Musculoskeletal:  Positive  for back pain.     Physical Exam Triage Vital Signs ED Triage Vitals  Encounter Vitals Group     BP      Systolic BP Percentile      Diastolic BP Percentile      Pulse      Resp      Temp      Temp src      SpO2      Weight      Height      Head Circumference      Peak Flow      Pain Score      Pain Loc      Pain Education      Exclude from Growth Chart    No data found.  Updated Vital Signs BP 121/74 (BP Location: Left Arm)   Pulse 78   Temp 98.3 F (36.8 C) (Oral)   Resp 19   LMP 12/29/2022 (Exact Date)   SpO2 100%   Visual Acuity Right Eye Distance:   Left Eye Distance:   Bilateral Distance:    Right Eye Near:   Left Eye Near:    Bilateral Near:     Physical Exam Vitals and nursing note reviewed.  Constitutional:      Appearance: Normal appearance. She is not ill-appearing.  HENT:     Head: Normocephalic and atraumatic.  Cardiovascular:     Rate and Rhythm: Normal rate and regular rhythm.      Pulses: Normal pulses.     Heart sounds: Normal heart sounds. No murmur heard.    No friction rub. No gallop.  Pulmonary:     Effort: Pulmonary effort is normal.     Breath sounds: Normal breath sounds. No wheezing, rhonchi or rales.  Abdominal:     Palpations: Abdomen is soft.     Tenderness: There is no abdominal tenderness. There is no right CVA tenderness, left CVA tenderness, guarding or rebound.  Skin:    General: Skin is warm and dry.     Capillary Refill: Capillary refill takes less than 2 seconds.  Neurological:     General: No focal deficit present.     Mental Status: She is alert and oriented to person, place, and time.      UC Treatments / Results  Labs (all labs ordered are listed, but only abnormal results are displayed) Labs Reviewed  WET PREP, GENITAL - Abnormal; Notable for the following components:      Result Value   Yeast Wet Prep HPF POC PRESENT (*)    Clue Cells Wet Prep HPF POC PRESENT (*)    WBC, Wet Prep HPF POC <10 (*)    All other components within normal limits  CERVICOVAGINAL ANCILLARY ONLY    EKG   Radiology No results found.  Procedures Procedures (including critical care time)  Medications Ordered in UC Medications - No data to display  Initial Impression / Assessment and Plan / UC Course  I have reviewed the triage vital signs and the nursing notes.  Pertinent labs & imaging results that were available during my care of the patient were reviewed by me and considered in my medical decision making (see chart for details).   Patient is a nontoxic-appearing 33 year old female requesting retesting for STIs in the setting of a recent diagnosis of gonorrhea that was treated with Rocephin .  Both she and her partner were positive and they were both treated.  They have since had unprotected intercourse with each other.  The patient reports that she has been noticing an intermittent whitish discharge when she wipes but no vaginal itching.  Also  no urinary symptoms.  She has had some mild back pain.  She recently been constipated and noticed that when she had a bowel movement that there was some white mucus on her stool.  She denies any rectal bleeding.  Physical exam reveals a benign cardiopulmonary exam.  No CVA tenderness.  Abdomen is soft and nontender.  I will order a cervical vaginal cytology swab as well as a vaginal wet prep is at the patient's last visit she was positive for both yeast and BV.  Wet prep is positive for both yeast and bacterial vaginosis.  I will discharge patient home with a diagnosis of BV and vaginal yeast infection on metronidazole  500 mg twice daily for 7 days and Diflucan  150 mg now and every 3 days for total of 3 doses.     Final Clinical Impressions(s) / UC Diagnoses   Final diagnoses:  BV (bacterial vaginosis)  Vaginal yeast infection  Screen for STD (sexually transmitted disease)     Discharge Instructions      Take the Flagyl  (metronidazole ) 500 mg twice daily for treatment of your bacterial vaginosis.  Avoid alcohol while on the metronidazole  as taken together will cause of vomiting.  Bacterial vaginosis is often caused by a imbalance of bacteria in your vaginal vault.  This is sometimes a result of using tampons or hormonal fluctuations during her menstrual cycle.  You if your symptoms are recurrent you can try using a boric acid suppository twice weekly to help maintain the acid-base balance in your vagina vault which could prevent further infection.  You can also try vaginal probiotics to help return normal bacterial balance.   For your vaginal yeast infection take 1 Diflucan  tablet now and repeat dosing every 3 days for total of 3 doses.  Your testing for gonorrhea and chlamydia will be back in the next 2 days.  If you test positive for either infection you will be contacted by phone and treatment options will be provided.  If the results are negative they will appear in your  MyChart.  Avoid intercourse, or any unprotected intercourse, until after you have received your test results.     ED Prescriptions     Medication Sig Dispense Auth. Provider   metroNIDAZOLE  (FLAGYL ) 500 MG tablet Take 1 tablet (500 mg total) by mouth 2 (two) times daily. 14 tablet Kent Pear, NP   fluconazole  (DIFLUCAN ) 150 MG tablet Take 1 tablet (150 mg total) by mouth every 3 (three) days for 3 doses. 3 tablet Kent Pear, NP      PDMP not reviewed this encounter.   Kent Pear, NP 02/09/23 [phone]

## 2023-02-09 NOTE — ED Triage Notes (Signed)
 Patient wants to be retested for STD.   Back pain White mucus in stool Vagina is swollen No itching White vaginal discharge

## 2023-02-10 LAB — CERVICOVAGINAL ANCILLARY ONLY
Chlamydia: NEGATIVE
Comment: NEGATIVE
Comment: NEGATIVE
Comment: NORMAL
Neisseria Gonorrhea: NEGATIVE
Trichomonas: NEGATIVE

## 2023-11-29 ENCOUNTER — Ambulatory Visit

## 2024-02-20 ENCOUNTER — Ambulatory Visit
